# Patient Record
Sex: Male | Born: 1945 | Race: White | Hispanic: No | Marital: Married
Health system: Midwestern US, Academic
[De-identification: ages and names within clinical notes are randomized; demographics above are authoritative.]

---

## 2017-03-21 MED ORDER — SODIUM CHLORIDE 0.9 % IJ SOLN
50 mL | Freq: Once | INTRAVENOUS | 0 refills | Status: CP
Start: 2017-03-21 — End: ?

## 2017-03-21 MED ORDER — IOHEXOL 300 MG IODINE/ML IV SOLN
100 mL | Freq: Once | INTRAVENOUS | 0 refills | Status: CP
Start: 2017-03-21 — End: ?

## 2017-03-22 MED ORDER — (INV) IPILIMUMAB (SPECIAL CONC) IVPB
1 mg/kg | Freq: Once | INTRAVENOUS | 0 refills | Status: CN
Start: 2017-03-22 — End: ?

## 2017-03-22 MED ORDER — (INV) NIVOLUMAB IVPB
240 mg | Freq: Once | INTRAVENOUS | 0 refills | Status: CP
Start: 2017-03-22 — End: ?

## 2017-03-22 MED ORDER — (INV) NIVOLUMAB IVPB
240 mg | Freq: Once | INTRAVENOUS | 0 refills | Status: CN
Start: 2017-03-22 — End: ?

## 2017-03-22 MED ORDER — TRAMADOL 50 MG PO TAB
ORAL_TABLET | Freq: Four times a day (QID) | 0 refills | Status: DC | PRN
Start: 2017-03-22 — End: 2017-04-26

## 2017-03-29 MED ORDER — GABAPENTIN 400 MG PO CAP
800 mg | ORAL_CAPSULE | Freq: Two times a day (BID) | ORAL | 1 refills | Status: DC
Start: 2017-03-29 — End: 2017-04-15

## 2017-03-29 MED ORDER — SERTRALINE 100 MG PO TAB
200 mg | ORAL_TABLET | Freq: Every day | ORAL | 1 refills | Status: DC
Start: 2017-03-29 — End: 2017-04-15

## 2017-03-29 MED ORDER — TRAZODONE 100 MG PO TAB
ORAL_TABLET | 1 refills | Status: DC | PRN
Start: 2017-03-29 — End: 2017-06-29

## 2017-03-29 MED ORDER — ALPRAZOLAM 0.5 MG PO TAB
.25-.5 mg | ORAL_TABLET | Freq: Every day | ORAL | 1 refills | Status: DC | PRN
Start: 2017-03-29 — End: 2017-06-29

## 2017-04-05 MED ORDER — (INV) NIVOLUMAB IVPB
240 mg | Freq: Once | INTRAVENOUS | 0 refills | Status: CP
Start: 2017-04-05 — End: ?

## 2017-04-05 MED ORDER — (INV) IPILIMUMAB (SPECIAL CONC) IVPB
1 mg/kg | Freq: Once | INTRAVENOUS | 0 refills | Status: CP
Start: 2017-04-05 — End: ?

## 2017-04-07 MED ORDER — MIDAZOLAM 1 MG/ML IJ SOLN
1-2 mg | Freq: Once | INTRAVENOUS | 0 refills | Status: CP
Start: 2017-04-07 — End: ?

## 2017-04-07 MED ORDER — FENTANYL CITRATE (PF) 50 MCG/ML IJ SOLN
50-200 ug | Freq: Once | INTRAVENOUS | 0 refills | Status: CP
Start: 2017-04-07 — End: ?

## 2017-04-07 MED ORDER — CEFAZOLIN INJ 1GM IVP
0 refills | Status: CP
Start: 2017-04-07 — End: ?

## 2017-04-07 MED ORDER — FENTANYL CITRATE (PF) 50 MCG/ML IJ SOLN
0 refills | Status: CP
Start: 2017-04-07 — End: ?

## 2017-04-07 MED ORDER — MIDAZOLAM 1 MG/ML IJ SOLN
0 refills | Status: CP
Start: 2017-04-07 — End: ?

## 2017-04-07 MED ORDER — SODIUM CHLORIDE 0.9 % IV SOLP
0 refills | Status: CP
Start: 2017-04-07 — End: ?

## 2017-04-11 MED ORDER — LIDOCAINE-PRILOCAINE 2.5-2.5 % TP CREA
2 refills | Status: AC
Start: 2017-04-11 — End: ?

## 2017-04-12 MED ORDER — LEVOTHYROXINE 50 MCG PO TAB
50 ug | ORAL_TABLET | Freq: Every day | ORAL | 3 refills | 30.00000 days | Status: DC
Start: 2017-04-12 — End: 2017-04-12

## 2017-04-12 MED ORDER — LEVOTHYROXINE 50 MCG PO TAB
50 ug | ORAL_TABLET | Freq: Every day | ORAL | 0 refills | 30.00000 days | Status: DC
Start: 2017-04-12 — End: 2017-06-06

## 2017-04-15 MED ORDER — GABAPENTIN 400 MG PO CAP
800 mg | ORAL_CAPSULE | Freq: Two times a day (BID) | ORAL | 1 refills | Status: DC
Start: 2017-04-15 — End: 2017-06-22

## 2017-04-15 MED ORDER — SERTRALINE 100 MG PO TAB
200 mg | ORAL_TABLET | Freq: Every day | ORAL | 1 refills | Status: DC
Start: 2017-04-15 — End: 2017-09-21

## 2017-04-19 MED ORDER — HEPARIN, PORCINE (PF) 100 UNIT/ML IV SYRG
500 [IU] | Freq: Once | 0 refills | Status: CP
Start: 2017-04-19 — End: ?

## 2017-04-19 MED ORDER — (INV) NIVOLUMAB IVPB
240 mg | Freq: Once | INTRAVENOUS | 0 refills | Status: CP
Start: 2017-04-19 — End: ?

## 2017-04-26 MED ORDER — TRAMADOL 50 MG PO TAB
ORAL_TABLET | Freq: Four times a day (QID) | 0 refills | Status: DC | PRN
Start: 2017-04-26 — End: 2017-05-24

## 2017-05-03 MED ORDER — (INV) NIVOLUMAB IVPB
240 mg | Freq: Once | INTRAVENOUS | 0 refills | Status: CP
Start: 2017-05-03 — End: ?

## 2017-05-03 MED ORDER — HEPARIN, PORCINE (PF) 100 UNIT/ML IV SYRG
500 [IU] | Freq: Once | 0 refills | Status: CP
Start: 2017-05-03 — End: ?

## 2017-05-16 ENCOUNTER — Encounter: Admit: 2017-05-16 | Discharge: 2017-05-17 | Payer: MEDICARE

## 2017-05-16 ENCOUNTER — Encounter: Admit: 2017-05-16 | Discharge: 2017-05-16 | Payer: MEDICARE

## 2017-05-16 DIAGNOSIS — E042 Nontoxic multinodular goiter: ICD-10-CM

## 2017-05-16 DIAGNOSIS — C787 Secondary malignant neoplasm of liver and intrahepatic bile duct: ICD-10-CM

## 2017-05-16 DIAGNOSIS — Z452 Encounter for adjustment and management of vascular access device: ICD-10-CM

## 2017-05-16 DIAGNOSIS — C49A3 Gastrointestinal stromal tumor of small intestine: Principal | ICD-10-CM

## 2017-05-16 DIAGNOSIS — Z006 Encounter for examination for normal comparison and control in clinical research program: ICD-10-CM

## 2017-05-16 DIAGNOSIS — I2510 Atherosclerotic heart disease of native coronary artery without angina pectoris: ICD-10-CM

## 2017-05-16 DIAGNOSIS — Z79899 Other long term (current) drug therapy: Secondary | ICD-10-CM

## 2017-05-16 DIAGNOSIS — E039 Hypothyroidism, unspecified: ICD-10-CM

## 2017-05-16 DIAGNOSIS — N281 Cyst of kidney, acquired: ICD-10-CM

## 2017-05-16 LAB — URINALYSIS DIPSTICK
Lab: 1 (ref 1.003–1.035)
Lab: 6 (ref 5.0–8.0)

## 2017-05-16 LAB — CBC AND DIFF
Lab: 10 10*3/uL (ref 4.5–11.0)
Lab: 15 g/dL (ref 13.5–16.5)
Lab: 4.9 M/UL (ref 4.4–5.5)
Lab: 46 % (ref 40–50)

## 2017-05-16 MED ORDER — SODIUM CHLORIDE 0.9 % IJ SOLN
50 mL | Freq: Once | INTRAVENOUS | 0 refills | Status: CP
Start: 2017-05-16 — End: ?
  Administered 2017-05-16: 20:00:00 50 mL via INTRAVENOUS

## 2017-05-16 MED ORDER — HEPARIN, PORCINE (PF) 100 UNIT/ML IV SYRG
500 [IU] | Freq: Once | INTRAVENOUS | 0 refills | Status: CP
Start: 2017-05-16 — End: ?

## 2017-05-16 MED ORDER — IOHEXOL 300 MG IODINE/ML IV SOLN
100 mL | Freq: Once | INTRAVENOUS | 0 refills | Status: CP
Start: 2017-05-16 — End: ?
  Administered 2017-05-16: 20:00:00 100 mL via INTRAVENOUS

## 2017-05-16 NOTE — Progress Notes
Pt here for ND and access for scan.  Multiple tubes drawn.  Returned after scan for flush and deaccess.  Dismissed ambulatory, unaccompanied.

## 2017-05-17 ENCOUNTER — Encounter: Admit: 2017-05-17 | Discharge: 2017-05-17 | Payer: MEDICARE

## 2017-05-17 DIAGNOSIS — C49A3 Gastrointestinal stromal tumor of small intestine: Principal | ICD-10-CM

## 2017-05-17 DIAGNOSIS — C787 Secondary malignant neoplasm of liver and intrahepatic bile duct: ICD-10-CM

## 2017-05-17 DIAGNOSIS — E042 Nontoxic multinodular goiter: ICD-10-CM

## 2017-05-17 DIAGNOSIS — Z006 Encounter for examination for normal comparison and control in clinical research program: ICD-10-CM

## 2017-05-17 LAB — LIPASE: Lab: 30 U/L (ref 11–82)

## 2017-05-17 LAB — COMPREHENSIVE METABOLIC PANEL
Lab: 1.3 mg/dL — ABNORMAL HIGH (ref 0.4–1.24)
Lab: 100 MMOL/L (ref 98–110)
Lab: 119 mg/dL — ABNORMAL HIGH (ref 70–100)
Lab: 135 MMOL/L — ABNORMAL LOW (ref 137–147)
Lab: 137 U/L — ABNORMAL HIGH (ref 25–110)
Lab: 26 MMOL/L (ref 21–30)
Lab: 4.2 g/dL — ABNORMAL HIGH (ref 3.5–5.0)
Lab: 4.6 MMOL/L (ref 3.5–5.1)
Lab: 7.7 g/dL — ABNORMAL LOW (ref 6.0–8.0)
Lab: 81 U/L — ABNORMAL HIGH (ref 7–56)
Lab: 9 K/UL — ABNORMAL HIGH (ref 3–12)
Lab: 9.7 mg/dL (ref 8.5–10.6)
Lab: >60 mL/min (ref >60)

## 2017-05-17 LAB — FREE T4-FREE THYROXINE: Lab: 1.4 ng/dL (ref 0.6–1.6)

## 2017-05-17 LAB — TSH WITH FREE T4 REFLEX: Lab: 33 uU/mL — ABNORMAL HIGH (ref 0.35–5.00)

## 2017-05-17 LAB — CORTISOL,RANDOM: Lab: 7.8 ug/dL (ref 5.0–20.0)

## 2017-05-17 LAB — THYROID STIMULATING HORMONE-TSH: Lab: 33 uU/mL — ABNORMAL HIGH (ref 0.35–5.00)

## 2017-05-17 LAB — AMYLASE: Lab: 32 U/L — ABNORMAL LOW (ref >60)

## 2017-05-17 LAB — ANTI-THYROPEROXIDASE (MICROSOMAL)AB

## 2017-05-17 LAB — THYROGLOBULIN AB: Lab: 3.3 [IU]/mL (ref <4.11)

## 2017-05-17 LAB — FREE T4 (FREE THYROXINE) ONLY: Lab: 1.4 ng/dL (ref 0.6–1.6)

## 2017-05-17 MED ORDER — REGORAFENIB 40 MG PO TAB
ORAL_TABLET | Freq: Every day | ORAL | 0 refills | Status: AC
Start: 2017-05-17 — End: 2017-08-10

## 2017-05-17 NOTE — Progress Notes
Initial Assessment: Oral Chemotherapy  Regorafenib (Stivarga???)    Willie Sandoval is a 71 y.o. male with a diagnosis of Recurrent GIST    Indication/Regimen    Regorafenib (Stivarga???) is being used appropriately for treatment of Recurrent GIST.  This medication is considered high risk per our internal oral chemotherapy risk categorization and the patient will be contacted for education, toxicity check at 2 weeks, and reassessment every 3 months, if applicable (high risk patient).      The dosing regimen of Regorafenib 160 mg (four 40 mg tabs) by mouth once daily on Days 1 to 21 of a 28-day cycle is appropriate for [name]. It is planned to continue until progression or unacceptable toxicity.     Patient History:   Cancer Diagnosis: Recurrent GIST   Past treatment regimens:   Gleevec  Sutent  DART trial - combined immunotherapy  D/t progression, plans now to transition to Regorafenib     Past Treatment Plans    ONCOLOGY 1   Plan Name Cycles Start Date Discontinue Date Discontinue Reason Discontinue User    (INV) HSC [phone]; [phone]; NIVOLUMAB + IPILIMUMAB 4 of 8 cycles started 11/30/2016 05/17/2017 Progression Stasia Cavalier, MD    OP GI SUNITINIB 37.5 MG (CONTINUOUSLY) (PNET/GIST)  Treatment not started 09/03/2016 11/26/2016 Progression Stasia Cavalier, MD    OP GI IMATINIB Treatment not started 04/23/2016 09/03/2016 Progression Stasia Cavalier, MD          Wt Readings from Last 1 Encounters:   05/17/17 126.7 kg (279 lb 6.4 oz)        Estimated body surface area is 2.59 meters squared as calculated from the following:    Height as of an earlier encounter on 05/17/17: 190.5 cm (75).    Weight as of an earlier encounter on 05/17/17: 126.7 kg (279 lb 6.4 oz).    Allergies:  Allergies   Allergen Reactions   ??? Levaquin [Levofloxacin] ANAPHYLAXIS   ??? 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 Wool RASH       Baseline Labs:     CBC w/Diff    Lab Results Component Value Date/Time    WBC 10.4 05/16/2017 01:40 PM    RBC 4.92 05/16/2017 01:40 PM    HGB 15.7 05/16/2017 01:40 PM    HCT 46.2 05/16/2017 01:40 PM    MCV 94.1 05/16/2017 01:40 PM    MCH 31.9 05/16/2017 01:40 PM    MCHC 33.9 05/16/2017 01:40 PM    RDW 14.5 05/16/2017 01:40 PM    PLTCT 182 05/16/2017 01:40 PM    MPV 8.0 05/16/2017 01:40 PM    Lab Results   Component Value Date/Time    NEUT 67 05/16/2017 01:40 PM    ANC 7.00 05/16/2017 01:40 PM    LYMA 16 (L) 05/16/2017 01:40 PM    ALC 1.70 05/16/2017 01:40 PM    MONA 8 05/16/2017 01:40 PM    AMC 0.80 05/16/2017 01:40 PM    EOSA 8 (H) 05/16/2017 01:40 PM    AEC 0.80 (H) 05/16/2017 01:40 PM    BASA 1 05/16/2017 01:40 PM    ABC 0.10 05/16/2017 01:40 PM          Comprehensive Metabolic Profile    Lab Results   Component Value Date/Time    NA 135 (L) 05/16/2017 01:40 PM    K 4.6 05/16/2017 01:40 PM  CL 100 05/16/2017 01:40 PM    CO2 26 05/16/2017 01:40 PM    GAP 9 05/16/2017 01:40 PM    BUN 18 05/16/2017 01:40 PM    CR 1.32 (H) 05/16/2017 01:40 PM    GLU 119 (H) 05/16/2017 01:40 PM    Lab Results   Component Value Date/Time    CA 9.7 05/16/2017 01:40 PM    PO4 3.9 06/27/2015 05:04 AM    ALBUMIN 4.2 05/16/2017 01:40 PM    TOTPROT 7.7 05/16/2017 01:40 PM    ALKPHOS 137 (H) 05/16/2017 01:40 PM    AST 86 (H) 05/16/2017 01:40 PM    ALT 81 (H) 05/16/2017 01:40 PM    TOTBILI 0.8 05/16/2017 01:40 PM    GFR 54 (L) 05/16/2017 01:40 PM    GFRAA >60 05/16/2017 01:40 PM        Serum creatinine: 1.32 mg/dL (H) 54/09/81 1914  Estimated creatinine clearance: 75.8 mL/min (A)    Pregnancy status    The patient???s pregnancy status was assessed. As patient is a male, education will be provided regarding adequate contraception for male partners of reproductive potential and contacting his physician immediately should his partner become pregnant.     Medication Reconciliation    Home Medications    Medication Sig acetaminophen (TYLENOL) 500 mg tablet Take 500 mg by mouth every 6 hours as needed for Pain. Max of 4,000 mg of acetaminophen in 24 hours.   ALPRAZolam (XANAX) 0.5 mg tablet Take 0.5-1 tablets by mouth daily as needed for Anxiety.   aspirin EC 81 mg tablet Take 81 mg by mouth daily.   BIFIDOBACTERIUM INFANTIS (ALIGN PO) Take 1 capsule by mouth. 5 billion cell capsule   Biotin 10,000 mcg cap Take 1 capsule by mouth daily.   CELECOXIB (CELEBREX PO) Take 1 tablet by mouth daily.   cyanocobalamin(+) (VITAMIN B-12) 500 mcg tablet Take 500 mcg by mouth daily.   cyclobenzaprine (FLEXERIL) 10 mg tablet Take 1 tablet by mouth twice daily as needed for Muscle Cramps.   ferrous sulfate (FEOSOL, FEROSUL) 325 mg (65 mg iron) tablet Take 325 mg by mouth daily. Take on an empty stomach at least 1 hour before or 2 hours after food.   fish oil /omega-3 fatty acids (SEA-OMEGA) 340/1000 mg capsule Take 1 Cap by mouth daily.   furosemide (LASIX) 40 mg tablet Take 40 mg by mouth daily.   gabapentin (NEURONTIN) 400 mg capsule Take 2 capsules by mouth twice daily. 90 day supply   Garlic 1,000 mg cap Take 1 Cap by mouth daily.   GLUCOSAMINE SULF/CHONDROITIN A (GLUCOSAMINE SULF-CHONDROITINSA PO) Take 1 tablet by mouth twice daily.   levothyroxine (SYNTHROID) 50 mcg tablet Take 1 tablet by mouth daily 30 minutes before breakfast.   lidocaine viscous-diphenhydrAMINE-alum/mag hydroxide/simeth 1:1:1 suspension Swish and Spit 10 mL by mouth as directed before meals and at bedtime.   lidocaine/prilocaine (EMLA) 2.5/2.5 % topical cream Apply to port area 30 minutes prior to access   lisinopril (PRINIVIL; ZESTRIL) 5 mg tablet Take 5 mg by mouth daily.   magnesium oxide (MAG-OX) 400 mg tablet Take 400 mg by mouth daily.   metoprolol (LOPRESSOR) 25 mg tablet Take 25 mg by mouth twice daily.   MV with Min-Lycopene-Lutein (CENTRUM SILVER) 0.4-300-250 mg-mcg-mcg tab Take 1 Tab by mouth daily. ondansetron (ZOFRAN) 8 mg tablet Take 1 tablet by mouth every 8 hours as needed for Nausea.   oxymetazoline (AFRIN) 0.05 % nasal spray Apply 1 Spray to each nostril as directed  twice daily.   potassium chloride SR (K-DUR) 20 mEq tablet Take 20 mEq by mouth daily. Take with a meal and a full glass of water.   ranitidine(+) (ZANTAC) 300 mg tablet Take 300 mg by mouth daily.   regorafenib (STIVARGA) 40 mg tablet Take 4 tabs by mouth daily on Days 1-21 of each 28-day cycle. Take at the same time each day with a low fat meal.   sertraline (ZOLOFT) 100 mg tablet Take 2 tablets by mouth daily. 90 day supply   simethicone (MYLICON) 80 mg chew tablet Take 1 Tab by mouth every 6 hours as needed.  Patient taking differently: Chew 80-160 mg by mouth daily as needed.   sucralfate (CARAFATE) 1 gram tablet Take 1 g by mouth daily as needed (as direc). Take on an empty stomach.   tamsulosin (FLOMAX) 0.4 mg capsule Take 1 Cap by mouth daily after breakfast.   traMADol (ULTRAM) 50 mg tablet 1-2 tablets every 6 hours as needed for pain   traZODone (DESYREL) 100 mg tablet Take 50 mg qhs.  May repeat an additional 50 mg qhs prn if not asleep in 1 hour.  90 day supply   triamcinolone (NASACORT) 55 mcg nasal inhaler Apply 2 Sprays to each nostril as directed at bedtime as needed.   vitamin E 400 unit capsule Take 400 Units by mouth daily.       Medication reconciliation is based on the patient???s most recent medication list in the electronic medical record (EMR) including herbal products and OTC medications. The patient's medication list will be updated during patient education, after speaking with the patient and prior to dispensing the medication.     Drug-drug interactions (DDIs)    DDIs were evaluated: No significant drug-drug interactions were identified.     Drug-Food Interactions    Drug-food interactions were evaluated.  Doses should be swallow whole with water after a low-fat meal (containing <600 calories and <30% fat).  Avoid grapefruit.    Contraindications    No contraindications to therapy were identified as there are no contraindications in the prescribing information.     Safety Precautions    The following safety precautions to the use of Regorafenib (Stivarga???) were reviewed:  ??? Cardiovascular events (myocardial ischemia, myocardial infarction)  ??? Dermatologic toxicity (hand-food skin reaction, erythema multiforme, Levonne Spiller syndrome)  ??? Gastrointestinal perforation  ??? Hemorrhage  ??? Hepatotoxicity  ??? Hypersensitivity  ??? Hypertension  ??? Reversible posterior leukoencephalopathy syndrome  ??? Wound healing impairment  ??? Asian patients: higher risk of hepatotoxicity and hand-foot skin reactions    Safety precautions for this medication have been reviewed. No concerns have been identified.     Risk Evaluation and Mitigation Strategy (REMS) Assessment    No REMS is required for this medication.     Initial therapy assessment has been completed and the patient will be contacted to complete education on their regimen.     Ramond Craver, [facility]  Clinical Pharmacist  05/17/17

## 2017-05-17 NOTE — Progress Notes
The Prior Authorization for Willie Sandoval was submitted for Willie Sandoval via [place].  Will continue to follow.    [place] Patient Advocate

## 2017-05-17 NOTE — Research Notes
Clinical research note for ZOX#[id number]   [id number] DART     SID # [id number]  End of treatment.    Patient is here for end of treatment visit due to progression by Recist 1.1.  Patient's last dose of study drug, nivolumab was on 05-03-2017 (Week 23).      Patient agrees to continue study follow up for survival.   Patient agrees to have disease progression study lab kit drawn at next clinic visit.  This should be next week.    Advised patient of need to follow-up with patient for 30 day follow-up safety visit which is done for all study patients.   Patient acknowledged understanding.     Per Dr. Edwin Dada, patient's TSH is elevated, yet Free T4 is WNL.   Patient's creatinine is also slightly elevated. Reviewed study algorithms for immunotherapy adverse events with Dr. Edwin Dada.  He will continue to monitor both levels for patient safety, but doesn't believe patient needs treatment at this time.    VF

## 2017-05-18 ENCOUNTER — Encounter: Admit: 2017-05-18 | Discharge: 2017-05-18 | Payer: MEDICARE

## 2017-05-18 DIAGNOSIS — E039 Hypothyroidism, unspecified: ICD-10-CM

## 2017-05-18 DIAGNOSIS — C49A4 Gastrointestinal stromal tumor of large intestine: Principal | ICD-10-CM

## 2017-05-18 LAB — ACTH: Lab: 5.6 U/L — ABNORMAL LOW (ref 7–40)

## 2017-05-18 NOTE — Progress Notes
The Prior Authorization for Willie Sandoval was approved for Willie Sandoval from 03/13/17 to 05/16/20.  The copay is [phone]. I left a voicemail for Willie Sandoval to see if he would like [place] to look for asst.    [place] Patient Advocate

## 2017-05-19 ENCOUNTER — Encounter: Admit: 2017-05-19 | Discharge: 2017-05-19 | Payer: MEDICARE

## 2017-05-19 NOTE — Progress Notes
Willie Sandoval has stated the Stivarga copay is not affordable.  Pharmacy will work on obtaining copay assistance and are currently waiting for him to fax over the household income.    [place] Patient Advocate

## 2017-05-19 NOTE — Telephone Encounter
Contacted pt and message given  He verbalized understanding    Labs ordered per Dr. Kalman Shan

## 2017-05-20 ENCOUNTER — Encounter: Admit: 2017-05-20 | Discharge: 2017-05-20 | Payer: MEDICARE

## 2017-05-20 DIAGNOSIS — E785 Hyperlipidemia, unspecified: ICD-10-CM

## 2017-05-20 DIAGNOSIS — I10 Essential (primary) hypertension: ICD-10-CM

## 2017-05-20 DIAGNOSIS — M7030 Other bursitis of elbow, unspecified elbow: ICD-10-CM

## 2017-05-20 DIAGNOSIS — C171 Malignant neoplasm of jejunum: Principal | ICD-10-CM

## 2017-05-20 DIAGNOSIS — Z9221 Personal history of antineoplastic chemotherapy: ICD-10-CM

## 2017-05-20 DIAGNOSIS — M1990 Unspecified osteoarthritis, unspecified site: ICD-10-CM

## 2017-05-20 DIAGNOSIS — F329 Major depressive disorder, single episode, unspecified: ICD-10-CM

## 2017-05-20 DIAGNOSIS — M5441 Lumbago with sciatica, right side: Secondary | ICD-10-CM

## 2017-05-20 DIAGNOSIS — F419 Anxiety disorder, unspecified: ICD-10-CM
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
## 2017-05-20 NOTE — Progress Notes
Seven Element Order    1. Patient Name: Willie Sandoval    2. Face to Face Completion Date: 05/20/2017  *Per Medicare guidelines, use the most recent date -- either the date of the office visit or the date the PT/ OT evaluation is signed.    3. Equipment Recommended: power wheelchair    4. All diagnoses relating to the need for recommended equipment:  1. Chronic bilateral low back pain with bilateral sciatica     2. GIST (gastrointestinal stromal tumor) of small bowel, malignant         5. Estimated Length of Need in Months, 1-99 (99=Lifetime): 99    6. Doctor Signature                                                  Heide Scales, M.D.    7. Date: 05/20/2017

## 2017-05-20 NOTE — Progress Notes
Emailed [facility]NC to obtain providers signature on [employer] application to the drug co.

## 2017-05-21 ENCOUNTER — Ambulatory Visit: Admit: 2017-05-20 | Discharge: 2017-05-21 | Payer: MEDICARE

## 2017-05-21 DIAGNOSIS — G8929 Other chronic pain: ICD-10-CM

## 2017-05-21 DIAGNOSIS — M5442 Lumbago with sciatica, left side: Principal | ICD-10-CM

## 2017-05-21 DIAGNOSIS — C49A3 Gastrointestinal stromal tumor of small intestine: ICD-10-CM

## 2017-05-23 ENCOUNTER — Encounter: Admit: 2017-05-23 | Discharge: 2017-05-23 | Payer: MEDICARE

## 2017-05-23 ENCOUNTER — Encounter: Admit: 2017-05-23 | Discharge: 2017-05-24 | Payer: MEDICARE

## 2017-05-23 DIAGNOSIS — C787 Secondary malignant neoplasm of liver and intrahepatic bile duct: ICD-10-CM

## 2017-05-23 DIAGNOSIS — I10 Essential (primary) hypertension: ICD-10-CM

## 2017-05-23 DIAGNOSIS — F419 Anxiety disorder, unspecified: ICD-10-CM

## 2017-05-23 DIAGNOSIS — M7030 Other bursitis of elbow, unspecified elbow: ICD-10-CM

## 2017-05-23 DIAGNOSIS — E039 Hypothyroidism, unspecified: ICD-10-CM

## 2017-05-23 DIAGNOSIS — C49A3 Gastrointestinal stromal tumor of small intestine: Principal | ICD-10-CM

## 2017-05-23 DIAGNOSIS — C171 Malignant neoplasm of jejunum: Principal | ICD-10-CM

## 2017-05-23 DIAGNOSIS — Z9221 Personal history of antineoplastic chemotherapy: ICD-10-CM

## 2017-05-23 DIAGNOSIS — M1990 Unspecified osteoarthritis, unspecified site: ICD-10-CM

## 2017-05-23 DIAGNOSIS — C49A4 Gastrointestinal stromal tumor of large intestine: ICD-10-CM

## 2017-05-23 DIAGNOSIS — E785 Hyperlipidemia, unspecified: ICD-10-CM

## 2017-05-23 DIAGNOSIS — F329 Major depressive disorder, single episode, unspecified: ICD-10-CM

## 2017-05-23 LAB — TSH WITH FREE T4 REFLEX: Lab: 14 uU/mL — ABNORMAL HIGH (ref 0.35–5.00)

## 2017-05-23 LAB — FREE T4-FREE THYROXINE: Lab: 1.8 ng/dL — ABNORMAL HIGH (ref 0.6–1.6)

## 2017-05-23 MED ORDER — HEPARIN, PORCINE (PF) 100 UNIT/ML IV SYRG
500 [IU] | Freq: Once | 0 refills | Status: CP
Start: 2017-05-23 — End: ?
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
## 2017-05-23 NOTE — Patient Education
Oral Chemotherapy Counseling  Regorafenib (Stivarga???)    Willie Sandoval and his wife were provided medication education regarding his new oral chemotherapy.     I reviewed the role of Willie Sandoval specialty pharmacy, including access to medication assistance specialists if needed.     How to take the medication:  Willie Sandoval was educated on regorafenib [name]???), the indication for treatment, dose, route, frequency and duration of therapy.     ??? Directions: 160 mg (four 40 mg tablets) by mouth once daily after a low-fat meal (<600 calories and <30% fat) on Days 1 through 21 of a 28-day cycle until disease progression or unacceptable toxicity.  ??? Patient was educated to swallow tablets whole and not to crush, chew or open tablets.     How to Store Medication:  YISHAI Sandoval was educated to store regorafenib (Stivarga???) at room temperature in a safe place away from humidity, pets, and children.  I recommended storing the medication in the original bottle and to leave the desiccant in place to protect from moisture.  I also recommended to discard any unused tablets 7 weeks after opening the bottle.  I recommended if family members would be handling the medication, they should use gloves.  Additionally, I recommended cleaning any surfaces touched by regorafenib (Stivarga???) with bleach, if possible.     Adherence:  Patient was educated on the importance of adherence.The patient's ability to be adherent with drug therapies was discussed and the patient was provided options for tools/resources that promote adherence to therapy. For Willie Sandoval calendars were recommended since the medication should not be put in a pill box (how he remembers the rest of his meds).     How to Manage Missed Doses:  I instructed the patient that if a dose is missed or vomited up, he should not take an extra dose to make it up. Instead, resume the medication at your next scheduled dose. Contraindications / Safety Precautions / Adverse Effects:  Contraindications to therapy, safety precautions, and common adverse effects were discussed with the patient.     REMS Program:  No REMS is required for this medication.    Drug-Drug Interactions:  A medication history and reconciliation was performed (including prescription medications, supplements, over the counter medications, and herbal products). The medication list was updated and the patient???s current medication list is included below.  I stressed the importance of maintaining an accurate medication list and informing their medical team prior to taking any new medications.     Home Medications    Medication Sig   acetaminophen (TYLENOL) 500 mg tablet Take 500 mg by mouth every 6 hours as needed for Pain. Max of 4,000 mg of acetaminophen in 24 hours.   ALPRAZolam (XANAX) 0.5 mg tablet Take 0.5-1 tablets by mouth daily as needed for Anxiety.   aspirin EC 81 mg tablet Take 81 mg by mouth daily.   BIFIDOBACTERIUM INFANTIS (ALIGN PO) Take 1 capsule by mouth. 5 billion cell capsule   Biotin 10,000 mcg cap Take 1 capsule by mouth daily.   CELECOXIB (CELEBREX PO) Take 1 tablet by mouth daily.   cyanocobalamin(+) (VITAMIN B-12) 500 mcg tablet Take 500 mcg by mouth daily.   cyclobenzaprine (FLEXERIL) 10 mg tablet Take 1 tablet by mouth twice daily as needed for Muscle Cramps.   ferrous sulfate (FEOSOL, FEROSUL) 325 mg (65 mg iron) tablet Take 325 mg by mouth daily. Take on an empty stomach at least 1 hour before or 2 hours  after food.   fish oil /omega-3 fatty acids (SEA-OMEGA) 340/1000 mg capsule Take 1 Cap by mouth daily.   furosemide (LASIX) 40 mg tablet Take 40 mg by mouth daily.   gabapentin (NEURONTIN) 400 mg capsule Take 2 capsules by mouth twice daily. 90 day supply   Garlic 1,000 mg cap Take 1 Cap by mouth daily.   GLUCOSAMINE SULF/CHONDROITIN A (GLUCOSAMINE SULF-CHONDROITINSA PO) Take 1 tablet by mouth twice daily. levothyroxine (SYNTHROID) 50 mcg tablet Take 1 tablet by mouth daily 30 minutes before breakfast.   lidocaine viscous-diphenhydrAMINE-alum/mag hydroxide/simeth 1:1:1 suspension Swish and Spit 10 mL by mouth as directed before meals and at bedtime.   lidocaine/prilocaine (EMLA) 2.5/2.5 % topical cream Apply to port area 30 minutes prior to access   lisinopril (PRINIVIL; ZESTRIL) 5 mg tablet Take 5 mg by mouth daily.   magnesium oxide (MAG-OX) 400 mg tablet Take 400 mg by mouth daily.   metoprolol (LOPRESSOR) 25 mg tablet Take 25 mg by mouth twice daily.   MV with Min-Lycopene-Lutein (CENTRUM SILVER) 0.4-300-250 mg-mcg-mcg tab Take 1 Tab by mouth daily.   ondansetron (ZOFRAN) 8 mg tablet Take 1 tablet by mouth every 8 hours as needed for Nausea.   oxymetazoline (AFRIN) 0.05 % nasal spray Apply 1 Spray to each nostril as directed twice daily.   potassium chloride SR (K-DUR) 20 mEq tablet Take 20 mEq by mouth daily. Take with a meal and a full glass of water.   ranitidine(+) (ZANTAC) 300 mg tablet Take 300 mg by mouth daily.   regorafenib (STIVARGA) 40 mg tablet Take 4 tabs by mouth daily on Days 1-21 of each 28-day cycle. Take at the same time each day with a low fat meal.   sertraline (ZOLOFT) 100 mg tablet Take 2 tablets by mouth daily. 90 day supply   simethicone (MYLICON) 80 mg chew tablet Take 1 Tab by mouth every 6 hours as needed.  Patient taking differently: Chew 80-160 mg by mouth daily as needed.   sucralfate (CARAFATE) 1 gram tablet Take 1 g by mouth daily as needed (as direc). Take on an empty stomach.   tamsulosin (FLOMAX) 0.4 mg capsule Take 1 Cap by mouth daily after breakfast.   traMADol (ULTRAM) 50 mg tablet 1-2 tablets every 6 hours as needed for pain   traZODone (DESYREL) 100 mg tablet Take 50 mg qhs.  May repeat an additional 50 mg qhs prn if not asleep in 1 hour.  90 day supply   triamcinolone (NASACORT) 55 mcg nasal inhaler Apply 2 Sprays to each nostril as directed at bedtime as needed. vitamin E 400 unit capsule Take 400 Units by mouth daily.     Drug-drug and drug-food interactions with the new therapy were assessed and reviewed with the patient. No significant drug-drug interactions were identified.   Patient was educated to avoid grapefruit.    What to do with any unused or expired medications:  NURI BILLEY was instructed to return any unused or expired medication to a disposal bin at one of the retail pharmacy locations or to utilize a community drug take back program.  Instructed not to flush the medication down the toilet.     Monitoring:  Monitoring and follow-up plan was discussed with patient. DAKEEM OLSHANSKY was instructed to contact the oral chemotherapy pharmacist at [phone] if they have any questions or concerns regarding their medication therapy. He is aware that the drug company is processing his application. He will call when he receives the medication so  that we can schedule appropriate follow up.     Questions:  Patient was given the opportunity to ask questions. Patient verbalized understanding, agreed with the plan and had no questions or concerns regarding therapy.     Lisette Abu Coti Burd, [facility]  Clinical Pharmacist  05/23/17

## 2017-05-24 ENCOUNTER — Encounter: Admit: 2017-05-24 | Discharge: 2017-05-24 | Payer: MEDICARE

## 2017-05-24 DIAGNOSIS — C49A3 Gastrointestinal stromal tumor of small intestine: Principal | ICD-10-CM

## 2017-05-24 DIAGNOSIS — F4323 Adjustment disorder with mixed anxiety and depressed mood: ICD-10-CM

## 2017-05-24 DIAGNOSIS — E039 Hypothyroidism, unspecified: Principal | ICD-10-CM

## 2017-05-24 DIAGNOSIS — F1010 Alcohol abuse, uncomplicated: ICD-10-CM

## 2017-05-24 LAB — CORTISOL,RANDOM: Lab: 6.9 ug/dL (ref 5.0–20.0)

## 2017-05-24 LAB — ACTH: Lab: 8.4

## 2017-05-24 MED ORDER — TRAMADOL 50 MG PO TAB
ORAL_TABLET | Freq: Four times a day (QID) | 0 refills | Status: AC | PRN
Start: 2017-05-24 — End: 2017-06-23
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
## 2017-05-24 NOTE — Telephone Encounter
Contacted pt and message given  Understanding verbalized  Lab ordered for one month    Closing encounter

## 2017-05-24 NOTE — Telephone Encounter
Please let pt know cortisol lab is normal for being drawn in the afternoon.  Thyroid labs are abnormal, but likely due to him having CT with contrast recently.  No change in his levothyroxine dose today.  Will you please order TSH and free T4 to be drawn in about a month?  Can be drawn with oncology labs.  thanks

## 2017-05-26 ENCOUNTER — Encounter: Admit: 2017-05-26 | Discharge: 2017-05-26 | Payer: MEDICARE

## 2017-05-26 NOTE — Progress Notes
Emailed [facility] to follow up on obtaining providers signature on Stivarga application to the drug co.

## 2017-05-26 NOTE — Progress Notes
Faxed Stivarga application to the drug co.

## 2017-05-30 ENCOUNTER — Encounter: Admit: 2017-05-30 | Discharge: 2017-05-30 | Payer: MEDICARE

## 2017-05-30 NOTE — Progress Notes
Willie Sandoval is still in process with the drug co.

## 2017-05-31 ENCOUNTER — Encounter: Admit: 2017-05-31 | Discharge: 2017-05-31 | Payer: MEDICARE

## 2017-05-31 NOTE — Progress Notes
The medication assistance application for Willie Sandoval has been approved for Willie Sandoval from 05/31/17 to 05/31/18.  The patient will receive the medication directly from the drug company during the approval period.    Drug Company Medication Nutritional therapist Information: [facility] Patient Advocate

## 2017-06-06 ENCOUNTER — Encounter: Admit: 2017-06-06 | Discharge: 2017-06-06 | Payer: MEDICARE

## 2017-06-06 MED ORDER — LEVOTHYROXINE 50 MCG PO TAB
50 ug | ORAL_TABLET | Freq: Every day | ORAL | 0 refills | 30.00000 days | Status: AC
Start: 2017-06-06 — End: 2017-07-22

## 2017-06-06 NOTE — Research Notes
Clinical research note for HKG#[id number]   S1609 DART     SID # [id number]  30 day safety follow-up    This CRC called patient 06/02/17 and 06/03/17 both home and cell numbers without success, even when leaving a message on his cell.    This CRC was able to successfully contact patient by his cell today.  He states he has been out of town.    I advised patient this is the 30 day study safety follow-up contact regarding adverse events.   I reviewed the ongoing AE's with the patient and updated the adverse event log.  AE log to be sent to Dr. Edwin Dada for review.    Patient states he has not experienced any new side effects in the approximately 30 days from his last dose.   Also states that he hasn't started his new oral treatment regimen yet, since he has been out of town.    Advised patient to call with any research related questions.  Patient verbalized understanding.      VF

## 2017-06-14 ENCOUNTER — Encounter: Admit: 2017-06-14 | Discharge: 2017-06-14 | Payer: MEDICARE

## 2017-06-14 NOTE — Telephone Encounter
The patient called to report he received his first shipment of regorafenib. Informed the patient that he can start taking it tomorrow morning or he can wait until after his lab draw on Thursday, either way is okay per Dr. Edwin Dada. The patient asked about a low fat diet with taking this medication. I discussed with pharmacy and informed the patient he will need to have a low fat breakfast when he takes this medication. The patient reports he usually eats a biscuit for breakfast and that should be okay to continue. All questions answered. Scheduling will reach out to the patient to schedule a two week lab and follow up with the NP.

## 2017-06-16 ENCOUNTER — Encounter: Admit: 2017-06-16 | Discharge: 2017-06-16 | Payer: MEDICARE

## 2017-06-16 ENCOUNTER — Encounter: Admit: 2017-06-16 | Discharge: 2017-06-17 | Payer: MEDICARE

## 2017-06-16 DIAGNOSIS — C49A3 Gastrointestinal stromal tumor of small intestine: Principal | ICD-10-CM

## 2017-06-16 LAB — CBC AND DIFF
Lab: 0.1 10*3/uL (ref 0–0.20)
Lab: 4.9 M/UL (ref 4.4–5.5)
Lab: 9.7 K/UL (ref 4.5–11.0)

## 2017-06-16 MED ORDER — HEPARIN, PORCINE (PF) 100 UNIT/ML IV SYRG
500 [IU] | Freq: Once | INTRAVENOUS | 0 refills | Status: CP
Start: 2017-06-16 — End: ?
  Administered 2017-06-16: 20:00:00 500 [IU] via INTRAVENOUS

## 2017-06-16 NOTE — Progress Notes
Port accessed.  Labs drawn.  Pt denies complaints.  Pt here with wife.    Port de-accessed.  Pt discharged in stable condition.

## 2017-06-17 LAB — COMPREHENSIVE METABOLIC PANEL
Lab: 1 mg/dL (ref 0.3–1.2)
Lab: 1.4 mg/dL — ABNORMAL HIGH (ref 0.4–1.24)
Lab: 10 mg/dL (ref 8.5–10.6)
Lab: 100 MMOL/L (ref 98–110)
Lab: 133 U/L — ABNORMAL HIGH (ref 25–110)
Lab: 139 MMOL/L (ref 137–147)
Lab: 19 mg/dL (ref 7–25)
Lab: 30 MMOL/L (ref 21–30)
Lab: 4.2 g/dL — ABNORMAL LOW (ref 3.5–5.0)
Lab: 4.3 MMOL/L (ref 3.5–5.1)
Lab: 49 mL/min — ABNORMAL LOW (ref >60)
Lab: 59 mL/min — ABNORMAL LOW (ref >60)
Lab: 61 U/L — ABNORMAL HIGH (ref 7–56)
Lab: 7.8 g/dL (ref 6.0–8.0)
Lab: 71 U/L — ABNORMAL HIGH (ref 7–40)
Lab: 9 K/UL (ref 3–12)
Lab: 98 mg/dL (ref 70–100)

## 2017-06-20 ENCOUNTER — Encounter: Admit: 2017-06-20 | Discharge: 2017-06-20 | Payer: MEDICARE

## 2017-06-20 NOTE — Telephone Encounter
Attempted to call Mitzi Davenport to perform reassessment for oral chemotherapy medication.  No answer. Left voicemail asking patient to return call to the pharmacist at [phone].       Ainsley Spinner, [facility]  Oncology Clinical Pharmacist  06/20/2017

## 2017-06-22 ENCOUNTER — Encounter: Admit: 2017-06-22 | Discharge: 2017-06-22 | Payer: MEDICARE

## 2017-06-22 DIAGNOSIS — F4323 Adjustment disorder with mixed anxiety and depressed mood: Principal | ICD-10-CM

## 2017-06-22 MED ORDER — GABAPENTIN 400 MG PO CAP
ORAL_CAPSULE | Freq: Two times a day (BID) | 1 refills | Status: AC
Start: 2017-06-22 — End: 2017-12-14

## 2017-06-23 ENCOUNTER — Encounter: Admit: 2017-06-23 | Discharge: 2017-06-23 | Payer: MEDICARE

## 2017-06-23 DIAGNOSIS — F4323 Adjustment disorder with mixed anxiety and depressed mood: Principal | ICD-10-CM

## 2017-06-23 DIAGNOSIS — F1010 Alcohol abuse, uncomplicated: ICD-10-CM

## 2017-06-23 DIAGNOSIS — C49A3 Gastrointestinal stromal tumor of small intestine: ICD-10-CM

## 2017-06-23 DIAGNOSIS — C787 Secondary malignant neoplasm of liver and intrahepatic bile duct: ICD-10-CM

## 2017-06-23 MED ORDER — TRAMADOL 50 MG PO TAB
ORAL_TABLET | Freq: Four times a day (QID) | 0 refills | Status: AC | PRN
Start: 2017-06-23 — End: 2017-07-27

## 2017-06-28 NOTE — Progress Notes
CONFIDENTIAL  Follow-up note    PATIENT: Willie Sandoval  DOB: [birth year]  DOS:06/23/2017  TIME: 9:00-9:50am  INTERVENTION: Supportive and CBT    SUMMARY:  Pt seen for follow-up psychotherapy.  Focus of discussion on patient's updates since beginning new treatment and recent vacation.  Patient noted some fatigue and mouth pain, which has decreased his productive tasks and use of alcohol.  Patient noted feeling somewhat like a burden to [place], but has been using CBT to aid in those thoughts.  Supportive therapy provided with review of coping strategies.    MSE/ASSESSMENT: Patient alert and Ox3. Speech fluid. Thoughts lucid and w/o evidence of psychoses. Memory grossly intact. Patient cooperative and with appropriate eye contact. Mood calm with congruent affect. Insight, judgment, and impulse control sufficient. No SI/HI, plans, or intent endorsed at this time.     IMPRESSIONS (based on DSM V): Alcohol abuse           Adjustment disorder with mixed anxiety and depressed mood            Medical Diagnosis GIST    PLAN: CBT    Vita Erm, PhD  Licensed Psychologist  [phone]

## 2017-06-29 ENCOUNTER — Ambulatory Visit: Admit: 2017-06-29 | Discharge: 2017-06-29 | Payer: MEDICARE

## 2017-06-29 ENCOUNTER — Encounter: Admit: 2017-06-29 | Discharge: 2017-06-29 | Payer: MEDICARE

## 2017-06-29 ENCOUNTER — Encounter: Admit: 2017-06-29 | Discharge: 2017-06-30 | Payer: MEDICARE

## 2017-06-29 DIAGNOSIS — F411 Generalized anxiety disorder: Principal | ICD-10-CM

## 2017-06-29 DIAGNOSIS — M7030 Other bursitis of elbow, unspecified elbow: ICD-10-CM

## 2017-06-29 DIAGNOSIS — C49A3 Gastrointestinal stromal tumor of small intestine: Principal | ICD-10-CM

## 2017-06-29 DIAGNOSIS — E785 Hyperlipidemia, unspecified: ICD-10-CM

## 2017-06-29 DIAGNOSIS — C171 Malignant neoplasm of jejunum: Principal | ICD-10-CM

## 2017-06-29 DIAGNOSIS — F1020 Alcohol dependence, uncomplicated: ICD-10-CM

## 2017-06-29 DIAGNOSIS — Z9221 Personal history of antineoplastic chemotherapy: ICD-10-CM

## 2017-06-29 DIAGNOSIS — M1990 Unspecified osteoarthritis, unspecified site: ICD-10-CM

## 2017-06-29 DIAGNOSIS — R7989 Other specified abnormal findings of blood chemistry: ICD-10-CM

## 2017-06-29 DIAGNOSIS — E039 Hypothyroidism, unspecified: ICD-10-CM

## 2017-06-29 DIAGNOSIS — C787 Secondary malignant neoplasm of liver and intrahepatic bile duct: ICD-10-CM

## 2017-06-29 DIAGNOSIS — I10 Essential (primary) hypertension: ICD-10-CM

## 2017-06-29 DIAGNOSIS — F329 Major depressive disorder, single episode, unspecified: ICD-10-CM

## 2017-06-29 DIAGNOSIS — F419 Anxiety disorder, unspecified: ICD-10-CM

## 2017-06-29 DIAGNOSIS — F4323 Adjustment disorder with mixed anxiety and depressed mood: ICD-10-CM

## 2017-06-29 LAB — COMPREHENSIVE METABOLIC PANEL
Lab: 1.3 mg/dL — ABNORMAL HIGH (ref 0.4–1.24)
Lab: 1.5 mg/dL — ABNORMAL HIGH (ref 0.3–1.2)
Lab: 100 MMOL/L (ref 98–110)
Lab: 135 MMOL/L — ABNORMAL LOW (ref 137–147)
Lab: 153 U/L — ABNORMAL HIGH (ref 25–110)
Lab: 17 mg/dL (ref 7–25)
Lab: 226 mg/dL — ABNORMAL HIGH (ref 70–100)
Lab: 26 MMOL/L (ref 21–30)
Lab: 4.1 g/dL — ABNORMAL LOW (ref 3.5–5.0)
Lab: 4.3 MMOL/L — ABNORMAL HIGH (ref 3.5–5.1)
Lab: 50 U/L — ABNORMAL HIGH (ref 7–40)
Lab: 7.6 g/dL (ref 6.0–8.0)
Lab: 9.6 mg/dL — ABNORMAL LOW (ref 8.5–10.6)

## 2017-06-29 LAB — CBC AND DIFF
Lab: 5.4 M/UL (ref 4.4–5.5)
Lab: 8.8 10*3/uL (ref 4.5–11.0)

## 2017-06-29 LAB — FREE T4-FREE THYROXINE: Lab: 2 ng/dL — ABNORMAL HIGH (ref 0.6–1.6)

## 2017-06-29 LAB — TSH WITH FREE T4 REFLEX: Lab: 41 uU/mL — ABNORMAL HIGH (ref 0.35–5.00)

## 2017-06-29 MED ORDER — SODIUM CHLORIDE 0.9 % IV SOLP
1000 mL | Freq: Once | INTRAVENOUS | 0 refills | Status: CP
Start: 2017-06-29 — End: ?
  Administered 2017-06-29: 19:00:00 1000 mL via INTRAVENOUS

## 2017-06-29 MED ORDER — ALPRAZOLAM 0.5 MG PO TAB
.25-.5 mg | ORAL_TABLET | Freq: Every day | ORAL | 1 refills | Status: AC | PRN
Start: 2017-06-29 — End: 2017-09-21

## 2017-06-29 MED ORDER — TRAZODONE 100 MG PO TAB
ORAL_TABLET | 1 refills | Status: AC | PRN
Start: 2017-06-29 — End: 2017-09-21

## 2017-06-29 MED ORDER — HEPARIN, PORCINE (PF) 100 UNIT/ML IV SYRG
500 [IU] | Freq: Once | INTRAVENOUS | 0 refills | Status: CP
Start: 2017-06-29 — End: ?
  Administered 2017-06-29: 21:00:00 500 [IU] via INTRAVENOUS

## 2017-06-29 MED ORDER — SODIUM CHLORIDE 0.9 % IV SOLP
1000 mL | Freq: Once | INTRAVENOUS | 0 refills | Status: CN
Start: 2017-06-29 — End: ?

## 2017-06-29 NOTE — Progress Notes
Subjective:       Willie Sandoval is a 71 y/o white male with a past psychiatric history of anxiety, depression, and alcohol dependence who presented to clinic for schedule follow up.  Marland Kitchen   ???  He presents to clinic today with his wife who was present for the duration of the visit with his permission.    ???  Since his last visit, he reports that he is doing ok.  He had to change his chemotherapy due to the tumors no longer responding to the trial drug.  Has been having some side effects from the switch.  Tolerating medications without issue.  His mood is stable.  His anxiety is also stable at this time.  Denies SI/HI/AH/VH.  Continues to engage in therapy with benefit.  ???  Social history review:  Lives at home with his wife. Retired. Has several hobbies including working restoring the [employer].   Denies tobacco and drug use.  Drinking less overall.  No longer hiding it.      Willie Sandoval is a 71 y.o. male.       Review of Systems   Constitutional: Negative for fatigue.   Gastrointestinal:        Has GIST   Musculoskeletal: Positive for arthralgias, back pain and gait problem.   Psychiatric/Behavioral: Positive for sleep disturbance. Negative for agitation, behavioral problems, confusion, decreased concentration, dysphoric mood, hallucinations, self-injury and suicidal ideas. The patient is nervous/anxious. The patient is not hyperactive.          Objective:         ??? acetaminophen (TYLENOL) 500 mg tablet Take 500 mg by mouth every 6 hours as needed for Pain. Max of 4,000 mg of acetaminophen in 24 hours.   ??? ALPRAZolam (XANAX) 0.5 mg tablet Take 0.5-1 tablets by mouth daily as needed for Anxiety.   ??? aspirin EC 81 mg tablet Take 81 mg by mouth daily.   ??? BIFIDOBACTERIUM INFANTIS (ALIGN PO) Take 1 capsule by mouth. 5 billion cell capsule   ??? Biotin 10,000 mcg cap Take 1 capsule by mouth daily.   ??? CELECOXIB (CELEBREX PO) Take 1 tablet by mouth daily. ??? cyanocobalamin(+) (VITAMIN B-12) 500 mcg tablet Take 500 mcg by mouth daily.   ??? cyclobenzaprine (FLEXERIL) 10 mg tablet Take 1 tablet by mouth twice daily as needed for Muscle Cramps.   ??? ferrous sulfate (FEOSOL, FEROSUL) 325 mg (65 mg iron) tablet Take 325 mg by mouth daily. Take on an empty stomach at least 1 hour before or 2 hours after food.   ??? fish oil /omega-3 fatty acids (SEA-OMEGA) 340/1000 mg capsule Take 1 Cap by mouth daily.   ??? furosemide (LASIX) 40 mg tablet Take 40 mg by mouth daily.   ??? gabapentin (NEURONTIN) 400 mg capsule TAKE 2 CAPSULES BY MOUTH TWICE A DAY   ??? Garlic 1,000 mg cap Take 1 Cap by mouth daily.   ??? GLUCOSAMINE SULF/CHONDROITIN A (GLUCOSAMINE SULF-CHONDROITINSA PO) Take 1 tablet by mouth twice daily.   ??? levothyroxine (SYNTHROID) 50 mcg tablet TAKE 1 TABLET BY MOUTH DAILY 30 MINUTES BEFORE BREAKFAST.   ??? lidocaine viscous-diphenhydrAMINE-alum/mag hydroxide/simeth 1:1:1 suspension Swish and Spit 10 mL by mouth as directed before meals and at bedtime.   ??? lidocaine/prilocaine (EMLA) 2.5/2.5 % topical cream Apply to port area 30 minutes prior to access   ??? lisinopril (PRINIVIL; ZESTRIL) 5 mg tablet Take 5 mg by mouth daily.   ??? magnesium oxide (MAG-OX) 400 mg tablet Take  400 mg by mouth daily.   ??? metoprolol (LOPRESSOR) 25 mg tablet Take 25 mg by mouth twice daily.   ??? MV with Min-Lycopene-Lutein (CENTRUM SILVER) 0.4-300-250 mg-mcg-mcg tab Take 1 Tab by mouth daily.   ??? ondansetron (ZOFRAN) 8 mg tablet Take 1 tablet by mouth every 8 hours as needed for Nausea.   ??? oxymetazoline (AFRIN) 0.05 % nasal spray Apply 1 spray to each nostril as directed twice daily as needed.   ??? potassium chloride SR (K-DUR) 20 mEq tablet Take 20 mEq by mouth daily. Take with a meal and a full glass of water.   ??? ranitidine(+) (ZANTAC) 300 mg tablet Take 300 mg by mouth daily.   ??? regorafenib (STIVARGA) 40 mg tablet Take 4 tabs by mouth daily on Days 1-21 of each 28-day cycle. Take at the same time each day with a low fat meal.   ??? sertraline (ZOLOFT) 100 mg tablet Take 2 tablets by mouth daily. 90 day supply   ??? simethicone (MYLICON) 80 mg chew tablet Take 1 Tab by mouth every 6 hours as needed. (Patient taking differently: Chew 80-160 mg by mouth daily as needed.)   ??? sucralfate (CARAFATE) 1 gram tablet Take 1 g by mouth daily as needed (as direc). Take on an empty stomach.   ??? tamsulosin (FLOMAX) 0.4 mg capsule Take 1 Cap by mouth daily after breakfast.   ??? traMADol (ULTRAM) 50 mg tablet 1-2 tablets every 6 hours as needed for pain   ??? traZODone (DESYREL) 100 mg tablet Take 50 mg qhs.  May repeat an additional 50 mg qhs prn if not asleep in 1 hour.  90 day supply   ??? triamcinolone (NASACORT) 55 mcg nasal inhaler Apply 2 Sprays to each nostril as directed at bedtime as needed.   ??? vitamin E 400 unit capsule Take 400 Units by mouth daily.     Vitals:    06/29/17 1109   BP: 133/83   Pulse: 81   Height: 190.5 cm (75)     There is no height or weight on file to calculate BMI.     Physical Exam   Psychiatric:   General/Constitutional: 71 y/o white male who appears his stated age.  Dressed in casual attire with appropriate grooming and hygiene.  Eye Contact: good  Behavior: calm and cooperative.  Pleasant to talk to  Speech: normal rate, rhythm, volume, and tone  Mood: good  Affect: Euthymic.  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    - Continue Sertraline 200 mg daily.  - Continue Trazodone 50-100 mg qhs prn for sleep.  - Continue Gabapentin.    Continue Alprazolam 0.25-0.5 mg daily as needed for anxiety with travel and procedures.  Instructed him to not mix Xanax and alcohol.  He and his wife expressed understanding.    - Encouraged continued use of CPAP machine.    -  Continue therapy with Dr. Deloria Lair.    Call clinic with questions or concerns.  Should suicidal thoughts, intentions or plans or homicidal thoughts, intentions or plans develop and/or worsen, please utilize one of the following resources including 911, ER, mental health provider or crisis hotline.    The proposed treatment plan was discussed with the patient/guardian who was provided the opportunity to ask questions and make suggestions regarding alternative treatment.     Return to clinic in 12 weeks.

## 2017-06-29 NOTE — Progress Notes
Labs drawn from port.    Patient c/o mouth sores - feels "raw".    Seen by Almyra Free, NP for full eval, added on for IVF.    1L NS given per orders.  Discharged to wife in good condition, ambulating with cane.
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
## 2017-07-01 ENCOUNTER — Encounter: Admit: 2017-07-01 | Discharge: 2017-07-01 | Payer: MEDICARE

## 2017-07-01 DIAGNOSIS — E039 Hypothyroidism, unspecified: ICD-10-CM

## 2017-07-01 DIAGNOSIS — R7989 Other specified abnormal findings of blood chemistry: Principal | ICD-10-CM

## 2017-07-01 LAB — TOTAL T3 (TRIIODOTHYRONINE): Lab: 263 ng/dL — ABNORMAL HIGH (ref 87–180)

## 2017-07-01 NOTE — Telephone Encounter
Thyroid labs continue to be abnormal, more abnormal.  I have added total T3 to specimen in lab.

## 2017-07-03 ENCOUNTER — Encounter: Admit: 2017-07-03 | Discharge: 2017-07-03 | Payer: MEDICARE

## 2017-07-03 DIAGNOSIS — F411 Generalized anxiety disorder: Principal | ICD-10-CM

## 2017-07-03 DIAGNOSIS — F4323 Adjustment disorder with mixed anxiety and depressed mood: ICD-10-CM

## 2017-07-03 MED ORDER — SERTRALINE 100 MG PO TAB
ORAL_TABLET | Freq: Every day | 1 refills
Start: 2017-07-03 — End: ?

## 2017-07-06 ENCOUNTER — Encounter: Admit: 2017-07-06 | Discharge: 2017-07-06 | Payer: MEDICARE

## 2017-07-06 NOTE — Telephone Encounter
I called and left VM for pt.  I will also send this info via Mychart.  We discussed his very unusual labs today at case conference.    Plan:   stop levothyroxine  Check free T4 by equilibrium dialysis (will place order as misc lab)  Check dilution TSH (will place order as misc lab).  I have asked him to do these things.

## 2017-07-06 NOTE — Progress Notes
We discussed the patient's labs today in Endocrinology case conference.  Labs are quite unusual and do not fit a typical profile for a patient  being treated with any of his current oncologic medications.  He is currently taking Opdivo and Yervoy both can cause abnormal thyroid labs and/or function.  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  This would be highly unlikely considering that his labs became  abnormal over a few month period of time.    Suggestions for further workup included serum free T4 by equilibrium dialysis and obtaining dilution of TSH, 1: 10 and 1: 100.  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  Another suggestion was to get the patient to have labs drawn in a different laboratory such as [facility] or [facility].  If there is an antibody or another substance interfering it would be unlikely to be occurring at another lab as well.    He was also suggested to discuss case with Dr. Sylvan Cheese.  I await a call back from him regarding this.  In the meantime I have asked the patient to stop taking levothyroxine.

## 2017-07-07 ENCOUNTER — Encounter: Admit: 2017-07-07 | Discharge: 2017-07-07 | Payer: MEDICARE

## 2017-07-07 ENCOUNTER — Encounter: Admit: 2017-07-07 | Discharge: 2017-07-08 | Payer: MEDICARE

## 2017-07-07 DIAGNOSIS — C171 Malignant neoplasm of jejunum: ICD-10-CM

## 2017-07-07 DIAGNOSIS — I10 Essential (primary) hypertension: ICD-10-CM

## 2017-07-07 DIAGNOSIS — K1231 Oral mucositis (ulcerative) due to antineoplastic therapy: ICD-10-CM

## 2017-07-07 DIAGNOSIS — T451X5A Adverse effect of antineoplastic and immunosuppressive drugs, initial encounter: ICD-10-CM

## 2017-07-07 DIAGNOSIS — F329 Major depressive disorder, single episode, unspecified: ICD-10-CM

## 2017-07-07 DIAGNOSIS — C49A3 Gastrointestinal stromal tumor of small intestine: Principal | ICD-10-CM

## 2017-07-07 DIAGNOSIS — C787 Secondary malignant neoplasm of liver and intrahepatic bile duct: ICD-10-CM

## 2017-07-07 DIAGNOSIS — M1990 Unspecified osteoarthritis, unspecified site: ICD-10-CM

## 2017-07-07 DIAGNOSIS — L271 Localized skin eruption due to drugs and medicaments taken internally: ICD-10-CM

## 2017-07-07 DIAGNOSIS — F419 Anxiety disorder, unspecified: ICD-10-CM

## 2017-07-07 DIAGNOSIS — Z9221 Personal history of antineoplastic chemotherapy: ICD-10-CM

## 2017-07-07 DIAGNOSIS — M7030 Other bursitis of elbow, unspecified elbow: ICD-10-CM

## 2017-07-07 DIAGNOSIS — E785 Hyperlipidemia, unspecified: ICD-10-CM

## 2017-07-07 LAB — CBC AND DIFF
Lab: 0.1 10*3/uL (ref 0–0.20)
Lab: 11 10*3/uL — ABNORMAL HIGH (ref 4.5–11.0)
Lab: 5 M/UL (ref 4.4–5.5)

## 2017-07-07 MED ORDER — HEPARIN, PORCINE (PF) 100 UNIT/ML IV SYRG
500 [IU] | Freq: Once | INTRAVENOUS | 0 refills | Status: CP
Start: 2017-07-07 — End: ?
  Administered 2017-07-07: 20:00:00 500 [IU] via INTRAVENOUS

## 2017-07-07 NOTE — Progress Notes
Labs drawn from port and port flushed per protocol.    Seen by Latricia Heft, NP for full eval.

## 2017-07-07 NOTE — Progress Notes
Oral Chemotherapy Pharmacist Note    Mr. Voorhees is due for a C1 toxicity check, however medication is on hold due to adverse effects of hand/foot syndrome and mouth sores. Reassessment pushed back 2 weeks.
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
## 2017-07-08 LAB — COMPREHENSIVE METABOLIC PANEL
Lab: 1.1 mg/dL (ref 0.3–1.2)
Lab: 1.2 mg/dL — ABNORMAL HIGH (ref 0.4–1.24)
Lab: 100 MMOL/L (ref 98–110)
Lab: 129 mg/dL — ABNORMAL HIGH (ref 70–100)
Lab: 136 MMOL/L — ABNORMAL LOW (ref 137–147)
Lab: 158 U/L — ABNORMAL HIGH (ref 25–110)
Lab: 18 mg/dL (ref 7–25)
Lab: 27 MMOL/L (ref 21–30)
Lab: 4.2 g/dL (ref 3.5–5.0)
Lab: 4.6 MMOL/L (ref 3.5–5.1)
Lab: 41 U/L (ref 7–56)
Lab: 44 U/L — ABNORMAL HIGH (ref 7–40)
Lab: 57 mL/min — ABNORMAL LOW (ref >60)
Lab: 7.7 g/dL (ref 6.0–8.0)
Lab: 9 10*3/uL (ref 3–12)
Lab: 9.8 mg/dL (ref 8.5–10.6)
Lab: >60 mL/min — ABNORMAL HIGH (ref >60)

## 2017-07-11 NOTE — Telephone Encounter
Please call pt and let him know I spoke with the head of lab and he wants Dong to stop biotin for 3 days, then have labs drawn.  Biotin can interfere with some of these labs, which may be why they are odd.  Stop biotin today and plan for lab draw later this week or early next week.  I will place orders for these.  thanks

## 2017-07-11 NOTE — Telephone Encounter
Contacted pt's wife, [name], discussed if pt had received message about Zaydyn stopping the Levothyroxine and having labs completed.   [name] verbalized that Khalon had reviewed this message last week and stopped the levothyroxine on the 07/07/17  I discussed with [name] that I would call her back with a plan, regarding Micharl completing labs since he stopped the levothyroxine on the 26th, and Dr. Kalman Shan recommending stopping the biotin for 3 days prior to labs.    She verbalized understanding.

## 2017-07-11 NOTE — Telephone Encounter
Discussed conversation with pt's wife, [name], regarding LT4 dose, biotin and completing labs.  Dr. Kalman Shan verbalized, Dak is to restart LT4 now, stop biotin and have labs completed in next week when at [facility] for other appointments.    Contacted [name] back and plan of care for Remigio discussed.  She verbalized understanding of having Atiba restart the LT4, stop biotin and have labs completed next week  He will be at [place] with Dr. Edwin Dada on 07/21/17, will have labs them.    Routing to Dr. Kalman Shan so she is aware.

## 2017-07-13 ENCOUNTER — Encounter: Admit: 2017-07-13 | Discharge: 2017-07-13 | Payer: MEDICARE

## 2017-07-13 ENCOUNTER — Ambulatory Visit: Admit: 2017-07-13 | Discharge: 2017-07-14 | Payer: MEDICARE

## 2017-07-13 DIAGNOSIS — I10 Essential (primary) hypertension: ICD-10-CM

## 2017-07-13 DIAGNOSIS — F419 Anxiety disorder, unspecified: ICD-10-CM

## 2017-07-13 DIAGNOSIS — F329 Major depressive disorder, single episode, unspecified: ICD-10-CM

## 2017-07-13 DIAGNOSIS — Z9221 Personal history of antineoplastic chemotherapy: ICD-10-CM

## 2017-07-13 DIAGNOSIS — M1990 Unspecified osteoarthritis, unspecified site: ICD-10-CM

## 2017-07-13 DIAGNOSIS — C171 Malignant neoplasm of jejunum: ICD-10-CM

## 2017-07-13 DIAGNOSIS — E785 Hyperlipidemia, unspecified: ICD-10-CM

## 2017-07-13 DIAGNOSIS — M7030 Other bursitis of elbow, unspecified elbow: ICD-10-CM

## 2017-07-13 NOTE — Progress Notes
[facility] CLINIC NOTE  Subjective     SUBJECTIVE: [name] presents in follow-up for ongoing care regarding back and lower extremity.  Pain is in the bilateral lumbar region describes intimately cervical.  Walking standing exacerbate the pain pain is improved with rest.  He has undergone epidural steroid injections in the past with good benefit is interested in having one repeated.         Review of Systems   Constitutional: Positive for appetite change and fatigue.   HENT: Positive for hearing loss, sinus pressure, tinnitus and voice change.    Eyes: Positive for redness.   Respiratory: Positive for apnea.    Gastrointestinal: Positive for abdominal distention.   Musculoskeletal: Positive for arthralgias, back pain and gait problem.   Psychiatric/Behavioral: Positive for agitation and decreased concentration.   All other systems reviewed and are negative.      Current Outpatient Prescriptions:   ???  acetaminophen (TYLENOL) 500 mg tablet, Take 500 mg by mouth every 6 hours as needed for Pain. Max of 4,000 mg of acetaminophen in 24 hours., Disp: , Rfl:   ???  ALPRAZolam (XANAX) 0.5 mg tablet, Take 0.5-1 tablets by mouth daily as needed for Anxiety., Disp: 30 tablet, Rfl: 1  ???  aspirin EC 81 mg tablet, Take 81 mg by mouth daily., Disp: , Rfl:   ???  BIFIDOBACTERIUM INFANTIS (ALIGN PO), Take 1 capsule by mouth. 5 billion cell capsule, Disp: , Rfl:   ???  Biotin 10,000 mcg cap, Take 1 capsule by mouth daily., Disp: , Rfl:   ???  CELECOXIB (CELEBREX PO), Take 1 tablet by mouth daily., Disp: , Rfl:   ???  cyanocobalamin(+) (VITAMIN B-12) 500 mcg tablet, Take 500 mcg by mouth daily., Disp: , Rfl:   ???  cyclobenzaprine (FLEXERIL) 10 mg tablet, Take 1 tablet by mouth twice daily as needed for Muscle Cramps., Disp: 60 tablet, Rfl: 3  ???  ferrous sulfate (FEOSOL, FEROSUL) 325 mg (65 mg iron) tablet, Take 325 mg by mouth daily. Take on an empty stomach at least 1 hour before or 2 hours after food., Disp: , Rfl: ???  fish oil /omega-3 fatty acids (SEA-OMEGA) 340/1000 mg capsule, Take 1 Cap by mouth daily., Disp: , Rfl:   ???  furosemide (LASIX) 40 mg tablet, Take 40 mg by mouth daily., Disp: , Rfl:   ???  gabapentin (NEURONTIN) 400 mg capsule, TAKE 2 CAPSULES BY MOUTH TWICE A DAY, Disp: 360 capsule, Rfl: 1  ???  Garlic 1,000 mg cap, Take 1 Cap by mouth daily., Disp: , Rfl:   ???  GLUCOSAMINE SULF/CHONDROITIN A (GLUCOSAMINE SULF-CHONDROITINSA PO), Take 1 tablet by mouth twice daily., Disp: , Rfl:   ???  levothyroxine (SYNTHROID) 50 mcg tablet, TAKE 1 TABLET BY MOUTH DAILY 30 MINUTES BEFORE BREAKFAST., Disp: 30 tablet, Rfl: 0  ???  lidocaine viscous-diphenhydrAMINE-alum/mag hydroxide/simeth 1:1:1 suspension, Swish and Spit 10 mL by mouth as directed before meals and at bedtime., Disp: 300 mL, Rfl: 0  ???  lidocaine/prilocaine (EMLA) 2.5/2.5 % topical cream, Apply to port area 30 minutes prior to access, Disp: 30 g, Rfl: 2  ???  lisinopril (PRINIVIL; ZESTRIL) 5 mg tablet, Take 5 mg by mouth daily., Disp: , Rfl:   ???  magnesium oxide (MAG-OX) 400 mg tablet, Take 400 mg by mouth daily., Disp: , Rfl:   ???  metoprolol (LOPRESSOR) 25 mg tablet, Take 25 mg by mouth twice daily., Disp: , Rfl:   ???  MV with Min-Lycopene-Lutein (CENTRUM SILVER)  0.4-300-250 mg-mcg-mcg tab, Take 1 Tab by mouth daily., Disp: , Rfl:   ???  ondansetron (ZOFRAN) 8 mg tablet, Take 1 tablet by mouth every 8 hours as needed for Nausea., Disp: 20 tablet, Rfl: 2  ???  oxymetazoline (AFRIN) 0.05 % nasal spray, Apply 1 spray to each nostril as directed twice daily as needed., Disp: , Rfl:   ???  potassium chloride SR (K-DUR) 20 mEq tablet, Take 20 mEq by mouth daily. Take with a meal and a full glass of water., Disp: , Rfl:   ???  ranitidine(+) (ZANTAC) 300 mg tablet, Take 300 mg by mouth daily., Disp: , Rfl:   ???  regorafenib (STIVARGA) 40 mg tablet, Take 4 tabs by mouth daily on Days 1-21 of each 28-day cycle. Take at the same time each day with a low fat meal., Disp: 84 tablet, Rfl: 0 ???  sertraline (ZOLOFT) 100 mg tablet, Take 2 tablets by mouth daily. 90 day supply, Disp: 180 tablet, Rfl: 1  ???  simethicone (MYLICON) 80 mg chew tablet, Take 1 Tab by mouth every 6 hours as needed. (Patient taking differently: Chew 80-160 mg by mouth daily as needed.), Disp: 30 Tab, Rfl: 0  ???  sucralfate (CARAFATE) 1 gram tablet, Take 1 g by mouth daily as needed (as direc). Take on an empty stomach., Disp: , Rfl:   ???  tamsulosin (FLOMAX) 0.4 mg capsule, Take 1 Cap by mouth daily after breakfast., Disp: 90 Cap, Rfl: 3  ???  traMADol (ULTRAM) 50 mg tablet, 1-2 tablets every 6 hours as needed for pain, Disp: 60 tablet, Rfl: 0  ???  traZODone (DESYREL) 100 mg tablet, Take 50 mg qhs.  May repeat an additional 50 mg qhs prn if not asleep in 1 hour.  90 day supply, Disp: 90 tablet, Rfl: 1  ???  triamcinolone (NASACORT) 55 mcg nasal inhaler, Apply 2 Sprays to each nostril as directed at bedtime as needed., Disp: , Rfl:   ???  vitamin E 400 unit capsule, Take 400 Units by mouth daily., Disp: , Rfl:   Allergies   Allergen Reactions   ??? Levaquin [Levofloxacin] ANAPHYLAXIS   ??? 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 Herbie Saxon     Physical Exam  Vitals:    07/13/17 1223   BP: 136/75   Pulse: 71   SpO2: 98%   Weight: 125.2 kg (276 lb)   Height: 190.5 cm (75)        Pain Score: Six  Body mass index is 34.5 kg/m???.  General: Alert and oriented, very pleasant male.   HEENT showed extraocular muscles were intact and no other abnormalities.  Unlabored breathing.  Regular rate and rhythm on CV exam.   5/5 strength in bilateral upper and lower extremities.    Sensation is intact to light touch and equal in the upper and lower extremities.  Bilateral lumbar tenderness to palpation       IMPRESSION:  1. Spondylosis of lumbosacral region without myelopathy or radiculopathy    2. Lumbar radiculopathy PLAN: We will schedule a bilateral L5-S1 transfemoral procedure injection of reducing back and leg pain

## 2017-07-14 ENCOUNTER — Encounter: Admit: 2017-07-14 | Discharge: 2017-07-14 | Payer: MEDICARE

## 2017-07-14 ENCOUNTER — Ambulatory Visit: Admit: 2017-07-14 | Discharge: 2017-07-15 | Payer: MEDICARE

## 2017-07-14 DIAGNOSIS — C171 Malignant neoplasm of jejunum: Principal | ICD-10-CM

## 2017-07-14 DIAGNOSIS — F419 Anxiety disorder, unspecified: ICD-10-CM

## 2017-07-14 DIAGNOSIS — M1990 Unspecified osteoarthritis, unspecified site: ICD-10-CM

## 2017-07-14 DIAGNOSIS — Z9221 Personal history of antineoplastic chemotherapy: ICD-10-CM

## 2017-07-14 DIAGNOSIS — I10 Essential (primary) hypertension: ICD-10-CM

## 2017-07-14 DIAGNOSIS — M7030 Other bursitis of elbow, unspecified elbow: ICD-10-CM

## 2017-07-14 DIAGNOSIS — E785 Hyperlipidemia, unspecified: ICD-10-CM

## 2017-07-14 DIAGNOSIS — M5416 Radiculopathy, lumbar region: Principal | ICD-10-CM

## 2017-07-14 DIAGNOSIS — F329 Major depressive disorder, single episode, unspecified: ICD-10-CM

## 2017-07-14 DIAGNOSIS — M47817 Spondylosis without myelopathy or radiculopathy, lumbosacral region: Principal | ICD-10-CM

## 2017-07-14 MED ORDER — MIDAZOLAM 1 MG/ML IJ SOLN
2 mg | Freq: Once | INTRAVENOUS | 0 refills | Status: CP
Start: 2017-07-14 — End: ?
  Administered 2017-07-14: 15:00:00 2 mg via INTRAVENOUS

## 2017-07-14 MED ORDER — IOPAMIDOL 41 % IT SOLN
2.5 mL | Freq: Once | EPIDURAL | 0 refills | Status: CP
Start: 2017-07-14 — End: ?
  Administered 2017-07-14: 15:00:00 2.5 mL via EPIDURAL

## 2017-07-14 MED ORDER — FENTANYL CITRATE (PF) 50 MCG/ML IJ SOLN
50 ug | Freq: Once | INTRAVENOUS | 0 refills | Status: CP
Start: 2017-07-14 — End: ?
  Administered 2017-07-14: 15:00:00 50 ug via INTRAVENOUS

## 2017-07-14 MED ORDER — HEPARIN, PORCINE (PF) 100 UNIT/ML IV SYRG
500 [IU] | Freq: Once | INTRAVENOUS | 0 refills | Status: CP
Start: 2017-07-14 — End: ?
  Administered 2017-07-14: 16:00:00 500 [IU] via INTRAVENOUS

## 2017-07-14 MED ORDER — TRIAMCINOLONE ACETONIDE 40 MG/ML IJ SUSP
80 mg | Freq: Once | EPIDURAL | 0 refills | Status: CP
Start: 2017-07-14 — End: ?
  Administered 2017-07-14: 15:00:00 80 mg via EPIDURAL

## 2017-07-14 NOTE — Procedures
Attending Surgeon: Keshawn Sundberg B Waynette Towers, MD    Anesthesia: Local    Pre-Procedure Diagnosis:   1. Lumbar disc disease with radiculopathy    2. Lumbar radicular pain        Post-Procedure Diagnosis:   1. Lumbar disc disease with radiculopathy    2. Lumbar radicular pain        SNR/TF LMBR/SAC  Procedure: transforaminal epidural    Laterality: bilateral  Location: lumbar - L5-S1      Consent:   Consent obtained: written  Consent given by: patient  Risks discussed: allergic reaction, bleeding, infection, weakness and no change or worsening in pain  Alternatives discussed: alternative treatment     Universal Protocol:  Relevant documents: relevant documents present and verified  Test results: test results available and properly labeled  Imaging studies: imaging studies available  Required items: required blood products, implants, devices, and special equipment available  Site marked: the operative site was marked  Patient identity confirmed: Patient identify confirmed verbally with patient.      Time out: Immediately prior to procedure a "time out" was called to verify the correct patient, procedure, equipment, support staff and site/side marked as required      Procedures Details:   Indications: pain   Prep: chlorhexidine  Patient position: prone  Estimated Blood Loss: minimal  Specimens: none  Number of Levels: 1  Guidance: fluoroscopy  Needle size: 25 G  Injection procedure: Negative aspiration for blood  Patient tolerance: Patient tolerated the procedure well with no immediate complications. Pressure was applied, and hemostasis was accomplished.  Outcome: Pain improved  Comments: Kenalog 40 mg was injected at each location      Estimated blood loss: none or minimal  Specimens: none  Patient tolerated the procedure well with no immediate complications. Pressure was applied, and hemostasis was accomplished.

## 2017-07-14 NOTE — Progress Notes
Sedation physician present in room.  Recent vitals and patient condition reviewed between sedating physician and nurse.  Reassessment completed.  Determination made to proceed with planned sedation.

## 2017-07-14 NOTE — Progress Notes
[facility]  INTERVENTIONAL PAIN PROCEDURE HISTORY AND PHYSICAL    Chief Complaint   Patient presents with   ??? Procedure     tfesi       HISTORY OF PRESENT ILLNESS:  Bilateral lumbar pain, presents for spinal injection    Past Medical History:   Diagnosis Date   ??? Anxiety disorder    ??? Arthritis    ??? Bursitis of elbow    ??? Depression    ??? Hyperlipidemia    ??? Hypertension    ??? 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 Personal history of antineoplastic chemotherapy 2009    received Gleevec 12/08 to 12/09       Past Surgical History:   Procedure Laterality Date   ??? 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 SONO GUIDANCE/NEEDLE BIOPSY  05/11/12    abdominal mass core biopsy, Gastrointestinal Stromal tumor   ??? CT HISTORICAL REPORT  03/15/13    CTabdomin- continued slight decrease in size of the dominant mesenteric mass and small mesenteric nodules compatible with favorable response to tx, stable exophytic L renal lesion   ??? UPPER GASTROINTESTINAL ENDOSCOPY N/A 03/20/2015    ESOPHAGOGASTRODUODENOSCOPY performed by Raynelle Chary, MD at [facility]GI   ??? ABDOMINAL EXPLORATION SURGERY N/A 06/20/2015    LAPAROTOMY EXPLORATORY, LYSIS OF ADHESIONS, POSSIBLE SMALL BOWEL RESECTION, POSSIBLE OSTOMY, SCAR REVISION performed by Genia Del, MD at [facility]   ??? HX APPENDECTOMY     ??? HX CHOLECYSTECTOMY     ??? HX MASTECTOMY      history of gynecomastia   ??? HX SURGERY      excision of intestinal tumor   ??? LUMBAR SPINE SURGERY      aspen device   ??? MASTECTOMY Bilateral     6th grade   ??? TONSIL AND ADENOIDECTOMY     ??? VASECTOMY         family history includes Alcohol abuse in his maternal uncle and maternal uncle; Cancer-Breast in his maternal grandmother and mother; Cancer-Colon in his maternal grandmother; Cancer-Prostate in his father.    Social History     Social History   ??? Marital status: Married Spouse name: N/A   ??? Number of children: N/A   ??? Years of education: N/A     Occupational History   ??? Not on file.     Social History Main Topics   ??? Smoking status: Former Smoker     Packs/day: 1.00     Years: 45.00     Types: Cigarettes     Start date: 08/23/1957     Quit date: 08/23/2006   ??? Smokeless tobacco: Never Used   ??? Alcohol use 7.0 oz/week     14 Standard drinks or equivalent per week   ??? Drug use: No   ??? Sexual activity: Yes     Partners: Female     Other Topics Concern   ??? Not on file     Social History Narrative   ??? No narrative on file       Allergies   Allergen Reactions   ??? Levaquin [Levofloxacin] ANAPHYLAXIS   ??? 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 [employer]  Vitals:    07/14/17 0922   BP: 133/72   Pulse: 62   Temp: 36.4 ???C (97.6 ???F)   TempSrc: Oral   SpO2: 98%   Weight: 125.2 kg (276 lb)   Height: 190.5 cm (75)       REVIEW OF SYSTEMS: 10 point ROS obtained and negative except for back and leg pain      PHYSICAL EXAM:  General: Alert and oriented, very pleasant male.   HEENT showed extraocular muscles were intact and no other abnormalities.  Unlabored breathing.  Regular rate and rhythm on CV exam.   5/5 strength in bilateral upper and lower extremities.    Sensation is intact to light touch and equal in the upper and lower extremities.    Bilateral lumbar tenderness to palpation    IMPRESSION:    1. Lumbar disc disease with radiculopathy    2. Lumbar radicular pain         PLAN: Lumbar Transforaminal Steroid Injection at bilateral L5-S1    General Pre Procedural Sedation ASA Classification      I have discussed risks and alternatives of this type of sedation and procedure with: patient    NPO Status:Acceptable    Pregnancy Status: No    Prior Anesthetic Types: General, Deep sedation, Moderate sedation and Anxiolysis    Patient's had previous experience with anesthesia and/or sedation complications: No Family history of sedation complications: No    Airway: Airway assessment performed II (soft palate, uvula, fauces visible)    Head and Neck: No abnormalities noted    Mouth: No abnormalities noted    Medications for Procedural Sedation: Midazolam and Fentanyl    Anesthesia Classification:  ASA II (A normal patient with mild systemic disease)    Patient remains a candidate for procedure: Yes    The intention for the procedure today is Anxiolysis/Analgesia.

## 2017-07-15 ENCOUNTER — Ambulatory Visit: Admit: 2017-07-14 | Discharge: 2017-07-15 | Payer: MEDICARE

## 2017-07-15 DIAGNOSIS — R45851 Suicidal ideations: ICD-10-CM

## 2017-07-15 DIAGNOSIS — F4323 Adjustment disorder with mixed anxiety and depressed mood: ICD-10-CM

## 2017-07-15 DIAGNOSIS — E785 Hyperlipidemia, unspecified: ICD-10-CM

## 2017-07-15 DIAGNOSIS — M5116 Intervertebral disc disorders with radiculopathy, lumbar region: Principal | ICD-10-CM

## 2017-07-15 DIAGNOSIS — Z85068 Personal history of other malignant neoplasm of small intestine: ICD-10-CM

## 2017-07-15 DIAGNOSIS — M5416 Radiculopathy, lumbar region: ICD-10-CM

## 2017-07-15 DIAGNOSIS — C49A4 Gastrointestinal stromal tumor of large intestine: ICD-10-CM

## 2017-07-15 DIAGNOSIS — I10 Essential (primary) hypertension: ICD-10-CM

## 2017-07-15 DIAGNOSIS — F1010 Alcohol abuse, uncomplicated: ICD-10-CM

## 2017-07-15 NOTE — Progress Notes
CONFIDENTIAL  Follow-up note    PATIENT: Willie Sandoval  DOB: [birth year]  DOS:07/14/2017  TIME: 8:05-8:55am  INTERVENTION: Supportive and CBT    SUMMARY:  Pt seen for follow-up psychotherapy.  Focus of discussion on patient's increase in anxiety related to pain, treatment side effects, and quality of life.  Patient noted internalizing these feelings because he doesn't want to overwhelm his spouse, whom he is also worried about.  He described passive suicidal thoughts last week when he was lethargic and swollen for 2 days related to his new cancer treatment.  Patient noted feeling better currently as the side effects decreased when he stopped the medication.  He denied having any active suicidal thoughts, plans, or intent.  Safety planning around these thoughts completed.    MSE/ASSESSMENT: Patient alert and Ox3. Speech fluid. Thoughts lucid and w/o evidence of psychoses. Memory grossly intact. Patient cooperative and with appropriate eye contact. Mood calm with congruent affect. Insight, judgment, and impulse control sufficient. No SI/HI, plans, or intent endorsed at this time.     IMPRESSIONS (based on DSM V): Alcohol abuse           Adjustment disorder with mixed anxiety and depressed mood            Medical Diagnosis GIST    PLAN: CBT    Vita Erm, PhD  Licensed Psychologist  [phone]

## 2017-07-21 ENCOUNTER — Encounter: Admit: 2017-07-21 | Discharge: 2017-07-22 | Payer: MEDICARE

## 2017-07-21 ENCOUNTER — Encounter: Admit: 2017-07-21 | Discharge: 2017-07-21 | Payer: MEDICARE

## 2017-07-21 DIAGNOSIS — E039 Hypothyroidism, unspecified: ICD-10-CM

## 2017-07-21 DIAGNOSIS — C49A3 Gastrointestinal stromal tumor of small intestine: Principal | ICD-10-CM

## 2017-07-21 DIAGNOSIS — R7989 Other specified abnormal findings of blood chemistry: Secondary | ICD-10-CM

## 2017-07-21 DIAGNOSIS — M1990 Unspecified osteoarthritis, unspecified site: ICD-10-CM

## 2017-07-21 DIAGNOSIS — Z9221 Personal history of antineoplastic chemotherapy: ICD-10-CM

## 2017-07-21 DIAGNOSIS — C787 Secondary malignant neoplasm of liver and intrahepatic bile duct: ICD-10-CM

## 2017-07-21 DIAGNOSIS — F329 Major depressive disorder, single episode, unspecified: ICD-10-CM

## 2017-07-21 DIAGNOSIS — C171 Malignant neoplasm of jejunum: Principal | ICD-10-CM

## 2017-07-21 DIAGNOSIS — I10 Essential (primary) hypertension: ICD-10-CM

## 2017-07-21 DIAGNOSIS — E785 Hyperlipidemia, unspecified: ICD-10-CM

## 2017-07-21 DIAGNOSIS — F419 Anxiety disorder, unspecified: ICD-10-CM

## 2017-07-21 DIAGNOSIS — M7030 Other bursitis of elbow, unspecified elbow: ICD-10-CM

## 2017-07-21 LAB — CBC AND DIFF
Lab: 10 10*3/uL (ref 4.5–11.0)
Lab: 16 g/dL — ABNORMAL HIGH (ref 13.5–16.5)
Lab: 49 % (ref 40–50)
Lab: 5.1 M/UL (ref 4.4–5.5)

## 2017-07-21 LAB — COMPREHENSIVE METABOLIC PANEL
Lab: 0.7 mg/dL (ref 0.3–1.2)
Lab: 10 K/UL (ref 3–12)
Lab: 101 MMOL/L (ref 98–110)
Lab: 137 MMOL/L (ref 137–147)
Lab: 154 mg/dL — ABNORMAL HIGH (ref 70–100)
Lab: 161 U/L — ABNORMAL HIGH (ref 25–110)
Lab: 19 mg/dL (ref 7–25)
Lab: 26 MMOL/L (ref 21–30)
Lab: 4.1 MMOL/L (ref 3.5–5.1)
Lab: 4.3 g/dL (ref 3.5–5.0)
Lab: 40 U/L (ref 7–56)
Lab: 45 U/L — ABNORMAL HIGH (ref 7–40)
Lab: 8.1 g/dL — ABNORMAL HIGH (ref 6.0–8.0)
Lab: 9.6 mg/dL (ref 8.5–10.6)
Lab: >60 mL/min (ref >60)
Lab: >60 mL/min (ref >60)

## 2017-07-21 LAB — THYROID STIMULATING HORMONE-TSH: Lab: 41 uU/mL — ABNORMAL HIGH (ref 0.35–5.00)

## 2017-07-21 LAB — TOTAL T3 (TRIIODOTHYRONINE): Lab: 89 ng/dL (ref 87–180)

## 2017-07-21 LAB — FREE T4 (FREE THYROXINE) ONLY: Lab: 0.6 ng/dL (ref 0.6–1.6)

## 2017-07-21 MED ORDER — HEPARIN, PORCINE (PF) 100 UNIT/ML IV SYRG
500 [IU] | Freq: Once | INTRAVENOUS | 0 refills | Status: CP
Start: 2017-07-21 — End: ?
  Administered 2017-07-21: 16:00:00 500 [IU] via INTRAVENOUS

## 2017-07-21 NOTE — Progress Notes
Oral Chemotherapy Pharmacist Note    Willie Sandoval is resuming regorafenib now at a 50% dose reduction. Toxicity check in 2 weeks.

## 2017-07-21 NOTE — Progress Notes
Pt here for ND/PF.  Port accessed easily with positive blood return when pt supine.  Dismissed ambulatory from treatment room, accompanied, to be seen in follow up.
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
## 2017-07-22 ENCOUNTER — Encounter: Admit: 2017-07-22 | Discharge: 2017-07-22 | Payer: MEDICARE

## 2017-07-22 DIAGNOSIS — E039 Hypothyroidism, unspecified: Principal | ICD-10-CM

## 2017-07-22 MED ORDER — LEVOTHYROXINE 100 MCG PO TAB
100 ug | ORAL_TABLET | Freq: Every day | ORAL | 11 refills | 30.00000 days | Status: AC
Start: 2017-07-22 — End: 2017-08-23
  Filled 2017-07-22: qty 30, 30d supply

## 2017-07-27 ENCOUNTER — Encounter: Admit: 2017-07-27 | Discharge: 2017-07-27 | Payer: MEDICARE

## 2017-07-27 MED ORDER — TRAMADOL 50 MG PO TAB
ORAL_TABLET | Freq: Four times a day (QID) | 0 refills | Status: AC | PRN
Start: 2017-07-27 — End: 2017-08-23

## 2017-07-27 NOTE — Telephone Encounter
Patients wife called for refill for Micheal's tramodol.  RX redilled by Latricia Heft, script taken to front desk for patient to pick up this afternoon.

## 2017-08-01 ENCOUNTER — Encounter: Admit: 2017-08-01 | Discharge: 2017-08-01 | Payer: MEDICARE

## 2017-08-05 ENCOUNTER — Encounter: Admit: 2017-08-05 | Discharge: 2017-08-05 | Payer: MEDICARE

## 2017-08-05 ENCOUNTER — Encounter: Admit: 2017-08-05 | Discharge: 2017-08-06 | Payer: MEDICARE

## 2017-08-05 DIAGNOSIS — C787 Secondary malignant neoplasm of liver and intrahepatic bile duct: ICD-10-CM

## 2017-08-05 DIAGNOSIS — F419 Anxiety disorder, unspecified: ICD-10-CM

## 2017-08-05 DIAGNOSIS — Z9221 Personal history of antineoplastic chemotherapy: ICD-10-CM

## 2017-08-05 DIAGNOSIS — C49A3 Gastrointestinal stromal tumor of small intestine: Principal | ICD-10-CM

## 2017-08-05 DIAGNOSIS — F329 Major depressive disorder, single episode, unspecified: ICD-10-CM

## 2017-08-05 DIAGNOSIS — M7030 Other bursitis of elbow, unspecified elbow: ICD-10-CM

## 2017-08-05 DIAGNOSIS — E785 Hyperlipidemia, unspecified: ICD-10-CM

## 2017-08-05 DIAGNOSIS — M1990 Unspecified osteoarthritis, unspecified site: ICD-10-CM

## 2017-08-05 DIAGNOSIS — I10 Essential (primary) hypertension: ICD-10-CM

## 2017-08-05 DIAGNOSIS — C171 Malignant neoplasm of jejunum: ICD-10-CM

## 2017-08-05 LAB — COMPREHENSIVE METABOLIC PANEL
Lab: 0.6 mg/dL (ref 0.3–1.2)
Lab: 1.1 mg/dL (ref 0.4–1.24)
Lab: 101 MMOL/L (ref 98–110)
Lab: 137 MMOL/L (ref 137–147)
Lab: 167 U/L — ABNORMAL HIGH (ref 25–110)
Lab: 19 mg/dL (ref 7–25)
Lab: 213 mg/dL — ABNORMAL HIGH (ref 70–100)
Lab: 28 MMOL/L (ref 21–30)
Lab: 34 U/L — ABNORMAL HIGH (ref 7–56)
Lab: 37 U/L (ref 7–40)
Lab: 4.1 MMOL/L (ref 3.5–5.1)
Lab: 4.1 g/dL — ABNORMAL LOW (ref 3.5–5.0)
Lab: 7.4 g/dL (ref 6.0–8.0)
Lab: 8 10*3/uL (ref 3–12)
Lab: 9.6 mg/dL (ref 8.5–10.6)
Lab: >60 mL/min (ref >60)
Lab: >60 mL/min (ref >60)

## 2017-08-05 LAB — CBC AND DIFF
Lab: 0.1 10*3/uL (ref 0–0.20)
Lab: 11 10*3/uL — ABNORMAL HIGH (ref 4.5–11.0)
Lab: 5 M/UL (ref 4.4–5.5)

## 2017-08-05 NOTE — Progress Notes
Oral Chemotherapy Cycle 1 Toxicity Check    Summary of Therapy/Adherence Assessment    Willie Sandoval continues on regorafenib for the treatment of GIST.  Cycle 1 start date was in July of 2018. He restarted at a reduced dose of 2 tablets/day 2 weeks ago. [name] confirms he is taking the medication as prescribed - 2 tablets daily. He has 1 full bottle of medication left at this point.     [name] reports missing one or 2 doses over the past 2  week(s) not related to toxicity. He states it is somewhat difficult to remember to take at the correct time w/ meals, his wife then states that when he forgets to take it at the correct time he goes ahead and takes it when he remembers - so it sounds like this is more of an issue with timing than with actually forgetting to take the medication.     No dose adjustments based on toxicity are required at this time. He is tolerating the reduced dose much better than the full dose.     Treatment will continue until progression or unacceptable toxicity.        CBC w/Diff    Lab Results   Component Value Date/Time    WBC 11.2 (H) 08/05/2017 08:56 AM    RBC 5.03 08/05/2017 08:56 AM    HGB 15.9 08/05/2017 08:56 AM    HCT 47.3 08/05/2017 08:56 AM    MCV 94.0 08/05/2017 08:56 AM    MCH 31.6 08/05/2017 08:56 AM    MCHC 33.6 08/05/2017 08:56 AM    RDW 14.1 08/05/2017 08:56 AM    PLTCT 169 08/05/2017 08:56 AM    MPV 8.1 08/05/2017 08:56 AM    Lab Results   Component Value Date/Time    NEUT 72 08/05/2017 08:56 AM    ANC 8.00 (H) 08/05/2017 08:56 AM    LYMA 21 (L) 08/05/2017 08:56 AM    ALC 2.40 08/05/2017 08:56 AM    MONA 4 08/05/2017 08:56 AM    AMC 0.50 08/05/2017 08:56 AM    EOSA 2 08/05/2017 08:56 AM    AEC 0.20 08/05/2017 08:56 AM    BASA 1 08/05/2017 08:56 AM    ABC 0.10 08/05/2017 08:56 AM        Comprehensive Metabolic Profile    Lab Results   Component Value Date/Time    NA 137 07/21/2017 11:23 AM    K 4.1 07/21/2017 11:23 AM    CL 101 07/21/2017 11:23 AM CO2 26 07/21/2017 11:23 AM    GAP 10 07/21/2017 11:23 AM    BUN 19 07/21/2017 11:23 AM    CR 1.18 07/21/2017 11:23 AM    GLU 154 (H) 07/21/2017 11:23 AM    Lab Results   Component Value Date/Time    CA 9.6 07/21/2017 11:23 AM    PO4 3.9 06/27/2015 05:04 AM    ALBUMIN 4.3 07/21/2017 11:23 AM    TOTPROT 8.1 (H) 07/21/2017 11:23 AM    ALKPHOS 161 (H) 07/21/2017 11:23 AM    AST 45 (H) 07/21/2017 11:23 AM    ALT 40 07/21/2017 11:23 AM    TOTBILI 0.7 07/21/2017 11:23 AM    GFR >60 07/21/2017 11:23 AM    GFRAA >60 07/21/2017 11:23 AM              Serum creatinine: 1.18 mg/dL 16/10/96 0454  Estimated creatinine clearance: 83.1 mL/min    Adverse Effects Assessment    Casimiro Needle  D [place] is tolerating regorafenib 2 tablets daily well at this time. Hand foot syndrome resolved, and Dr. Benjiman Core counseled him on moisturization today. He does complain of some dryness in mouth and throat but now mouth sores.     Interaction Check    PHILIP TISBY reports the following medication changes since the last medication history during their education visit: discontinued biotind    Drug-drug and drug-food interactions between the patients??? specialty medication and their medication list were assessed.     No significant drug-drug interactions were identified.     The patient was instructed to speak with their health care provider and/or the oral chemotherapy pharmacist before starting any new drug, including prescription or over the counter, natural / herbal products, or vitamins.     Risk Evaluation and Mitigation Strategy (REMS) Assessment    No REMS is required for this medication.    Followup Plan     Re-assessment has been completed. Next reassessment planned for 3   month(s).     Rosemarie Ax, [facility]  Oncology Clinical Pharmacist  08/05/2017

## 2017-08-05 NOTE — Progress Notes
Labs drawn from port without difficulties.  He states that he is doing well, no new issues.  Discharged in good condition.
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
## 2017-08-10 ENCOUNTER — Encounter: Admit: 2017-08-10 | Discharge: 2017-08-10 | Payer: MEDICARE

## 2017-08-10 DIAGNOSIS — C49A3 Gastrointestinal stromal tumor of small intestine: Principal | ICD-10-CM

## 2017-08-10 MED ORDER — REGORAFENIB 40 MG PO TAB
ORAL_TABLET | Freq: Every day | ORAL | 0 refills | Status: DC
Start: 2017-08-10 — End: 2017-08-11

## 2017-08-10 MED ORDER — REGORAFENIB 40 MG PO TAB
ORAL_TABLET | Freq: Every day | 0 refills | Status: CN
Start: 2017-08-10 — End: ?

## 2017-08-10 MED ORDER — REGORAFENIB 40 MG PO TAB
ORAL_TABLET | Freq: Every day | ORAL | 0 refills | Status: DC
Start: 2017-08-10 — End: 2017-08-10

## 2017-08-10 NOTE — Oral Chemotherapy Note
Name:Willie Sandoval           MRN: [id number]                 DOB:11-02-1946          Age: 71 y.o.  Date of Service: 08/10/17      Oral Chemotherapy Refill Review     Patient Information:     Correct patient/DOB/Diagnosis: Yes    Reviewed most recent clinic notes for changes in plan: Yes    Most recent labs reviewed: Yes    Appointment Information:   Date of last appointment:  08/05/2017    Follow up appointment scheduled : Yes 08/18/2017      Medication Information:     Medication name: stivarga    Medication in [place]: Yes     Date of last refill released from [facility]: 05/23/17    Weight change >10%: No    Has a dose adjustment been made since last refill?Yes    Is Pharmacy listed in [place] treatment plan? Yes    Pharmacy: [facility]    Consent Date 05/17/17    Patient Contact  Method of communication: telephone    confirmed need for refill: Yes  Patient taking medication as directed Yes  Confirmation of receiving pharmacy: Yes  Patient is anticipating a start date of: 08/11/17    Other Information:     Plan was routed to [employer]

## 2017-08-11 ENCOUNTER — Encounter: Admit: 2017-08-11 | Discharge: 2017-08-11 | Payer: MEDICARE

## 2017-08-11 DIAGNOSIS — F4323 Adjustment disorder with mixed anxiety and depressed mood: ICD-10-CM

## 2017-08-11 DIAGNOSIS — C49A3 Gastrointestinal stromal tumor of small intestine: Principal | ICD-10-CM

## 2017-08-11 DIAGNOSIS — F1010 Alcohol abuse, uncomplicated: Secondary | ICD-10-CM

## 2017-08-11 MED ORDER — REGORAFENIB 40 MG PO TAB
ORAL_TABLET | Freq: Every day | ORAL | 0 refills | Status: AC
Start: 2017-08-11 — End: 2017-09-19

## 2017-08-15 NOTE — Progress Notes
CONFIDENTIAL  Follow-up note    PATIENT: Willie Sandoval  DOB: 1946/08/10  DOS:08/11/2017  TIME: 2:00-2:50pm  INTERVENTION: Supportive and CBT    SUMMARY:  Pt seen for follow-up psychotherapy.  Focus of discussion on patient's stabilization in mood.  He noted mild improvement in fatigue and but an increase in pain with his hands, feet, and mouth.  Patient noted that he has not been drinking at all due to mouth sores.  He still is worried about his wife's mood.  Discussed self-care and ways of assisting her.    MSE/ASSESSMENT: Patient alert and Ox3. Speech fluid. Thoughts lucid and w/o evidence of psychoses. Memory grossly intact. Patient cooperative and with appropriate eye contact. Mood calm with congruent affect. Insight, judgment, and impulse control sufficient. No SI/HI, plans, or intent endorsed at this time.     IMPRESSIONS (based on DSM V): Alcohol abuse           Adjustment disorder with mixed anxiety and depressed mood            Medical Diagnosis GIST    PLAN: CBT    Vita Erm, PhD  Licensed Psychologist  [phone]

## 2017-08-17 ENCOUNTER — Encounter: Admit: 2017-08-17 | Discharge: 2017-08-17 | Payer: MEDICARE

## 2017-08-17 NOTE — Progress Notes
Contacted Theatre stage manager.  They sent communication yesterday (08/16/17) regarding the patients regorafenib (Stivarga).  Our records indicate that we faxed a new prescription for this medication on 08/11/17.  However, [facility] has no record of this.  They were able to take a verbal prescription over the phone.      Of note, regorafenib is dispensed in quantities of 28-tablet packages.  The patient is currently taking regorafenib 80 mg (two 40 mg tablets) by mouth daily on days 1-21 of each 28-day cycle.  He only requires 42 tablets to complete a cycle but we wrote the script for 56 tablets (two 28-tablet quantities.)  The pharmacy and the patient are aware of this discrepancy.    The patient stated that he started his next cycle of regorafenib and had enough supply on hand for one week.  He ran out of medication sometime in the last 3-4 days.  He understands the importance of remaining on this medication.  [facility] is processing his script and should reach out to him today.  If [facility] does not hear from them regarding delivery, he will call them or call our clinic for support.    Ainsley Spinner, [facility]  Oncology Clinical Pharmacist  08/17/2017

## 2017-08-18 ENCOUNTER — Encounter: Admit: 2017-08-18 | Discharge: 2017-08-18 | Payer: MEDICARE

## 2017-08-18 ENCOUNTER — Encounter: Admit: 2017-08-18 | Discharge: 2017-08-19 | Payer: MEDICARE

## 2017-08-18 DIAGNOSIS — R32 Unspecified urinary incontinence: ICD-10-CM

## 2017-08-18 DIAGNOSIS — R300 Dysuria: ICD-10-CM

## 2017-08-18 DIAGNOSIS — M1990 Unspecified osteoarthritis, unspecified site: ICD-10-CM

## 2017-08-18 DIAGNOSIS — E039 Hypothyroidism, unspecified: ICD-10-CM

## 2017-08-18 DIAGNOSIS — C49A3 Gastrointestinal stromal tumor of small intestine: Principal | ICD-10-CM

## 2017-08-18 DIAGNOSIS — Z9221 Personal history of antineoplastic chemotherapy: ICD-10-CM

## 2017-08-18 DIAGNOSIS — M47817 Spondylosis without myelopathy or radiculopathy, lumbosacral region: Principal | ICD-10-CM

## 2017-08-18 DIAGNOSIS — F419 Anxiety disorder, unspecified: ICD-10-CM

## 2017-08-18 DIAGNOSIS — M7030 Other bursitis of elbow, unspecified elbow: ICD-10-CM

## 2017-08-18 DIAGNOSIS — I10 Essential (primary) hypertension: ICD-10-CM

## 2017-08-18 DIAGNOSIS — F329 Major depressive disorder, single episode, unspecified: ICD-10-CM

## 2017-08-18 DIAGNOSIS — E785 Hyperlipidemia, unspecified: ICD-10-CM

## 2017-08-18 DIAGNOSIS — C171 Malignant neoplasm of jejunum: Principal | ICD-10-CM

## 2017-08-18 LAB — URINALYSIS MICROSCOPIC REFLEX TO CULTURE

## 2017-08-18 LAB — URINALYSIS DIPSTICK REFLEX TO CULTURE
Lab: NEGATIVE
Lab: NEGATIVE
Lab: POSITIVE — AB

## 2017-08-18 LAB — COMPREHENSIVE METABOLIC PANEL
Lab: 100 MMOL/L (ref 98–110)
Lab: 134 MMOL/L — ABNORMAL LOW (ref 137–147)
Lab: 134 mg/dL — ABNORMAL HIGH (ref 70–100)
Lab: 4.4 MMOL/L (ref 3.5–5.1)

## 2017-08-18 LAB — CBC AND DIFF
Lab: 12 10*3/uL — ABNORMAL HIGH (ref 4.5–11.0)
Lab: 4.5 M/UL (ref 4.4–5.5)

## 2017-08-18 MED ORDER — HEPARIN, PORCINE (PF) 100 UNIT/ML IV SYRG
500 [IU] | Freq: Once | INTRAVENOUS | 0 refills | Status: CP
Start: 2017-08-18 — End: ?
  Administered 2017-08-18: 18:00:00 500 [IU] via INTRAVENOUS

## 2017-08-18 MED ORDER — CYCLOBENZAPRINE 10 MG PO TAB
10 mg | ORAL_TABLET | Freq: Two times a day (BID) | ORAL | 3 refills | 21.00000 days | Status: AC | PRN
Start: 2017-08-18 — End: 2018-03-08

## 2017-08-18 NOTE — Progress Notes
PT here for port flush, lab draw.  Pt states he feels well.  Port accessed, flushed.   Brisk blood return noted, labs drawn.  Port packed with heparin and deaccessed.  Pt tolerated well.  Pt discharged in stable, ambulatory condition for follow up appt.
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
## 2017-08-19 ENCOUNTER — Encounter: Admit: 2017-08-19 | Discharge: 2017-08-19 | Payer: MEDICARE

## 2017-08-19 LAB — FREE T4-FREE THYROXINE: Lab: 0.8 ng/dL (ref 0.6–1.6)

## 2017-08-19 LAB — TSH WITH FREE T4 REFLEX: Lab: 10 uU/mL — ABNORMAL HIGH (ref 0.35–5.00)

## 2017-08-19 MED ORDER — SULFAMETHOXAZOLE-TRIMETHOPRIM 800-160 MG PO TAB
1 | ORAL_TABLET | Freq: Two times a day (BID) | ORAL | 0 refills | Status: AC
Start: 2017-08-19 — End: 2017-09-01

## 2017-08-19 NOTE — Progress Notes
Willie Sandoval was in our clinic yesterday and saw our NP Latricia Heft.  Patient was complaining of urgency and enuresis.  Patient left urine for UA which was sent for culture.  I showed the results to Dr. Edwin Dada and he ordered Bactrim DS bid for 5 days.  Called and spoke with patients wife [name] and informed her of UA results.  Script sent to [facility] in [place]. Informed [name] that we would call when culture results were available.

## 2017-08-21 ENCOUNTER — Encounter: Admit: 2017-08-21 | Discharge: 2017-08-21 | Payer: MEDICARE

## 2017-08-23 ENCOUNTER — Encounter: Admit: 2017-08-23 | Discharge: 2017-08-23 | Payer: MEDICARE

## 2017-08-23 ENCOUNTER — Ambulatory Visit: Admit: 2017-08-23 | Discharge: 2017-08-23 | Payer: MEDICARE

## 2017-08-23 DIAGNOSIS — E785 Hyperlipidemia, unspecified: ICD-10-CM

## 2017-08-23 DIAGNOSIS — F419 Anxiety disorder, unspecified: ICD-10-CM

## 2017-08-23 DIAGNOSIS — M7030 Other bursitis of elbow, unspecified elbow: ICD-10-CM

## 2017-08-23 DIAGNOSIS — E039 Hypothyroidism, unspecified: Principal | ICD-10-CM

## 2017-08-23 DIAGNOSIS — I10 Essential (primary) hypertension: ICD-10-CM

## 2017-08-23 DIAGNOSIS — M1990 Unspecified osteoarthritis, unspecified site: ICD-10-CM

## 2017-08-23 DIAGNOSIS — R739 Hyperglycemia, unspecified: ICD-10-CM

## 2017-08-23 DIAGNOSIS — Z9221 Personal history of antineoplastic chemotherapy: ICD-10-CM

## 2017-08-23 DIAGNOSIS — E042 Nontoxic multinodular goiter: ICD-10-CM

## 2017-08-23 DIAGNOSIS — F329 Major depressive disorder, single episode, unspecified: ICD-10-CM

## 2017-08-23 DIAGNOSIS — C171 Malignant neoplasm of jejunum: ICD-10-CM

## 2017-08-23 DIAGNOSIS — C49A3 Gastrointestinal stromal tumor of small intestine: ICD-10-CM

## 2017-08-23 MED ORDER — LEVOTHYROXINE 50 MCG PO TAB
50 ug | ORAL_TABLET | Freq: Every day | ORAL | 0 refills
Start: 2017-08-23 — End: ?

## 2017-08-23 MED ORDER — TRAMADOL 50 MG PO TAB
ORAL_TABLET | Freq: Four times a day (QID) | 0 refills | Status: AC | PRN
Start: 2017-08-23 — End: 2017-09-29

## 2017-08-23 MED ORDER — LEVOTHYROXINE 112 MCG PO TAB
112 ug | ORAL_TABLET | Freq: Every day | ORAL | 11 refills | 30.00000 days | Status: AC
Start: 2017-08-23 — End: ?

## 2017-08-23 NOTE — Progress Notes
Date of Service: 08/23/2017    Subjective:             Willie Sandoval is a 71 y.o. male.    History of Present Illness  Willie Sandoval is a 71 year old male who presents for initial endocrine evaluation of hypothyroidism.  He was referred by his oncologist, Dr. Benjiman Core.  I have reviewed notes from Dr. Benjiman Core as well as labs obtained over the 2 years.  He presents today with his wife.  He has a history of GIST tumor diagnosed in 2008, previously treated with the back but more recently with ipilimumab and nivolumab.    He was initially seen by me in clinic several months ago and at that time he started levothyroxine 50 mcg per day.  Since that time he has had very strange thyroid labs.  I then asked him to stop taking the biotin prior to having labs drawn in the labs obtained prior to her visit today are more normal.  TSH remains elevated at 10, though T4 is appropriate.  These labs are shown below.  TSH is 10.3, free T4 0.8.    He has been taking levothyroxine 100 mcg each morning for the last month.  He takes it on an empty stomach and waits 30 minutes before taking other medications or food.  He denies palpitations, difficulty sleeping, excessive sleeping, weight change, change in skin or nails.  He has been taking biotin because he has been losing hair.  He admits to having some change in voice, though this is been going on for a few years,  and he thinks his voice is actually improved recently.  He denies fullness in his neck.  He has known thyroid nodules and goiter and is following with Dr. Cherie Dark for this.         Review of Systems    A comprehensive review of systems was obtained and is negative except for the following: Heat sensitivity, ringing in the ears, decrease in hearing,dry mouth, wheezing, heartburn, lack of sex drive, numbness tingling, memory loss, joint pain and stiffness, back pain, muscle cramps.    Past Medical History:   Diagnosis Date   ??? Anxiety disorder    ??? Arthritis    ??? Bursitis of elbow ??? Depression    ??? Hyperlipidemia    ??? Hypertension    ??? 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 Personal history of antineoplastic chemotherapy 2009    received Gleevec 12/08 to 12/09     Past Surgical History:   Procedure Laterality Date   ??? 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 SONO GUIDANCE/NEEDLE BIOPSY  05/11/12    abdominal mass core biopsy, Gastrointestinal Stromal tumor   ??? CT HISTORICAL REPORT  03/15/13    CTabdomin- continued slight decrease in size of the dominant mesenteric mass and small mesenteric nodules compatible with favorable response to tx, stable exophytic L renal lesion   ??? UPPER GASTROINTESTINAL ENDOSCOPY N/A 03/20/2015    ESOPHAGOGASTRODUODENOSCOPY performed by Raynelle Chary, MD at ENDO[facility]GI   ??? ABDOMINAL EXPLORATION SURGERY N/A 06/20/2015    LAPAROTOMY EXPLORATORY, LYSIS OF ADHESIONS, POSSIBLE SMALL BOWEL RESECTION, POSSIBLE OSTOMY, SCAR REVISION performed by Genia Del, MD at [facility]   ??? HX APPENDECTOMY     ??? HX CHOLECYSTECTOMY     ???  HX MASTECTOMY      history of gynecomastia   ??? HX SURGERY      excision of intestinal tumor   ??? LUMBAR SPINE SURGERY      aspen device   ??? MASTECTOMY Bilateral     6th grade   ??? TONSIL AND ADENOIDECTOMY     ??? VASECTOMY       Family History   Problem Relation Age of Onset   ??? Cancer-Breast Mother    ??? Cancer-Breast Maternal Grandmother    ??? Cancer-Colon Maternal Grandmother    ??? Cancer-Prostate Father    ??? Alcohol abuse Maternal Uncle    ??? Alcohol abuse Maternal Uncle      Social History     Social History   ??? Marital status: Married     Spouse name: N/A   ??? Number of children: N/A   ??? Years of education: N/A     Social History Main Topics   ??? Smoking status: Former Smoker     Packs/day: 1.00     Years: 45.00     Types: Cigarettes     Start date: 08/23/1957     Quit date: 08/23/2006   ??? Smokeless tobacco: Never Used ??? Alcohol use 7.0 oz/week     14 Standard drinks or equivalent per week   ??? Drug use: No   ??? Sexual activity: Yes     Partners: Female     Other Topics Concern   ??? Not on file     Social History Narrative   ??? No narrative on file         Objective:         ??? acetaminophen (TYLENOL) 500 mg tablet Take 500 mg by mouth every 6 hours as needed for Pain. Max of 4,000 mg of acetaminophen in 24 hours.   ??? ALPRAZolam (XANAX) 0.5 mg tablet Take 0.5-1 tablets by mouth daily as needed for Anxiety.   ??? aspirin EC 81 mg tablet Take 81 mg by mouth daily.   ??? BIFIDOBACTERIUM INFANTIS (ALIGN PO) Take 1 capsule by mouth. 5 billion cell capsule   ??? CELECOXIB (CELEBREX PO) Take 1 tablet by mouth daily.   ??? cyanocobalamin(+) (VITAMIN B-12) 500 mcg tablet Take 500 mcg by mouth daily.   ??? cyclobenzaprine (FLEXERIL) 10 mg tablet Take one tablet by mouth twice daily as needed for Muscle Cramps.   ??? ferrous sulfate (FEOSOL, FEROSUL) 325 mg (65 mg iron) tablet Take 325 mg by mouth daily. Take on an empty stomach at least 1 hour before or 2 hours after food.   ??? fish oil /omega-3 fatty acids (SEA-OMEGA) 340/1000 mg capsule Take 1 Cap by mouth daily.   ??? furosemide (LASIX) 40 mg tablet Take 40 mg by mouth daily.   ??? gabapentin (NEURONTIN) 400 mg capsule TAKE 2 CAPSULES BY MOUTH TWICE A DAY   ??? Garlic 1,000 mg cap Take 1 Cap by mouth daily.   ??? GLUCOSAMINE SULF/CHONDROITIN A (GLUCOSAMINE SULF-CHONDROITINSA PO) Take 1 tablet by mouth twice daily.   ??? levothyroxine (SYNTHROID) 100 mcg tablet Take one tablet by mouth daily 30 minutes before breakfast.   ??? lidocaine viscous-diphenhydrAMINE-alum/mag hydroxide/simeth 1:1:1 suspension Swish and Spit 10 mL by mouth as directed before meals and at bedtime.   ??? lidocaine/prilocaine (EMLA) 2.5/2.5 % topical cream Apply to port area 30 minutes prior to access   ??? lisinopril (PRINIVIL; ZESTRIL) 5 mg tablet Take 5 mg by mouth daily.   ??? magnesium oxide (MAG-OX)  400 mg tablet Take 400 mg by mouth daily. ??? metoprolol (LOPRESSOR) 25 mg tablet Take 25 mg by mouth twice daily.   ??? MV with Min-Lycopene-Lutein (CENTRUM SILVER) 0.4-300-250 mg-mcg-mcg tab Take 1 Tab by mouth daily.   ??? ondansetron (ZOFRAN) 8 mg tablet Take 1 tablet by mouth every 8 hours as needed for Nausea.   ??? oxymetazoline (AFRIN) 0.05 % nasal spray Apply 1 spray to each nostril as directed twice daily as needed.   ??? potassium chloride SR (K-DUR) 20 mEq tablet Take 20 mEq by mouth daily. Take with a meal and a full glass of water.   ??? ranitidine(+) (ZANTAC) 300 mg tablet Take 300 mg by mouth daily.   ??? regorafenib (STIVARGA) 40 mg tablet Take 2 tabs by mouth daily on Days 1-21 of each 28-day cycle. Take at the same time each day with a low fat meal.   ??? sertraline (ZOLOFT) 100 mg tablet Take 2 tablets by mouth daily. 90 day supply   ??? simethicone (MYLICON) 80 mg chew tablet Take 1 Tab by mouth every 6 hours as needed. (Patient taking differently: Chew 80-160 mg by mouth daily as needed.)   ??? sucralfate (CARAFATE) 1 gram tablet Take 1 g by mouth daily as needed (as direc). Take on an empty stomach.   ??? tamsulosin (FLOMAX) 0.4 mg capsule Take 1 Cap by mouth daily after breakfast.   ??? traMADol (ULTRAM) 50 mg tablet 1-2 tablets every 6 hours as needed for pain   ??? traZODone (DESYREL) 100 mg tablet Take 50 mg qhs.  May repeat an additional 50 mg qhs prn if not asleep in 1 hour.  90 day supply   ??? triamcinolone (NASACORT) 55 mcg nasal inhaler Apply 2 Sprays to each nostril as directed at bedtime as needed.   ??? trimethoprim/sulfamethoxazole (BACTRIM DS) 160/800 mg tablet Take one tablet by mouth twice daily.   ??? vitamin E 400 unit capsule Take 400 Units by mouth daily.     Vitals:    08/23/17 1406   BP: 124/73   Pulse: 70   Weight: 121.1 kg (267 lb)   Height: 188 cm (74)     Body mass index is 34.28 kg/m???.     Physical Exam    Nursing note and vitals reviewed.  Constitutional: He is oriented to person, place, and time. He appears well-nourished. No distress.   HENT:   Head: Normocephalic and atraumatic. Cyst under left side of chin.  Mouth/Throat: Oropharynx is clear and moist.   Eyes: Conjunctivae normal and EOM are normal. Pupils are equal, round, and reactive to light.   Neck: Neck supple. Thyromegaly on right > left.   Cardiovascular: Normal rate, regular rhythm and normal heart sounds.    Pulmonary/Chest: Effort normal and breath sounds normal. No respiratory distress.   Abdominal: Soft. Bowel sounds are normal. He exhibits no distension.   Musculoskeletal: He exhibits no edema and no tenderness.   Lymphadenopathy:     He has no cervical adenopathy.   Neurological: He is alert and oriented to person, place, and time.      Skin: Skin is warm and dry. No erythema.   Psychiatric: He has a normal mood and affect. His behavior is normal. Thought content normal.            Ref. Range 08/18/2017 13:19   T4-Free Latest Ref Range: 0.6 - 1.6 NG/DL 0.8   TSH Latest Ref Range: 0.35 - 5.00 MCU/ML 10.320 (H)  Assessment and Plan:  Willie Sandoval was seen today for hypothyroidism.    Diagnoses and all orders for this visit:    Acquired hypothyroidism, uncontrolled  Multiple thyroid nodules  Goiter  Hypothyroidism diagnosed in April 2018 after brief period of hyperthyroidism in January 2018.  Unclear if this is related to his oncologic regimen of ipilimumab and nivolumab, as he has known structural abnormalities with thyroid nodules in both right and left lobes.  He is following with Dr. Cherie Dark for thyroid nodules and goiter.  Thyroid labs shown above while taking levothyroxine 100 mcg per day.  TSH is elevated.  Begin treatment with levothyroxine 112 mcg per day.  I have sent a prescription for this to his pharmacy.  Check TSH with free T4 reflex in 6-8 weeks.    Elevated serum glucose (new problem)  He has had several readings with elevated glucose, though these have been postprandial or unknown timing. Hemoglobin A1c to be checked with next lab draw.    GIST (gastrointestinal stromal tumor) of small bowel, malignant  Diagnosed initially in 2008.  Surgery with Dr. Cherie Dark.  He is following with Dr. Benjiman Core for this.  Current treatment is ipilimumab and nivolumab    Return to clinic in 3-6 months.

## 2017-08-26 ENCOUNTER — Encounter: Admit: 2017-08-26 | Discharge: 2017-08-26 | Payer: MEDICARE

## 2017-08-26 ENCOUNTER — Ambulatory Visit: Admit: 2017-08-26 | Discharge: 2017-08-27 | Payer: MEDICARE

## 2017-08-26 DIAGNOSIS — F329 Major depressive disorder, single episode, unspecified: ICD-10-CM

## 2017-08-26 DIAGNOSIS — Z9221 Personal history of antineoplastic chemotherapy: ICD-10-CM

## 2017-08-26 DIAGNOSIS — E785 Hyperlipidemia, unspecified: ICD-10-CM

## 2017-08-26 DIAGNOSIS — M7030 Other bursitis of elbow, unspecified elbow: ICD-10-CM

## 2017-08-26 DIAGNOSIS — M1990 Unspecified osteoarthritis, unspecified site: ICD-10-CM

## 2017-08-26 DIAGNOSIS — I10 Essential (primary) hypertension: ICD-10-CM

## 2017-08-26 DIAGNOSIS — F419 Anxiety disorder, unspecified: ICD-10-CM

## 2017-08-26 DIAGNOSIS — C171 Malignant neoplasm of jejunum: Principal | ICD-10-CM

## 2017-08-26 NOTE — Progress Notes
[facility] CLINIC NOTE  Subjective     SUBJECTIVE: Mr. Fendley presents in follow-up for ongoing care regarding back and lower extremity pain.  He is undergone bilateral L5-S1 transforaminal epidural steroid injections in the past with good benefit.  His main complaint today is left-sided lumbar pain which radiates in the posterior and lateral aspect of the left lower extremity to the foot.  He wishes to have another procedure scheduled.         Review of Systems   Constitutional: Positive for diaphoresis and fatigue.   HENT: Positive for congestion, hearing loss, rhinorrhea and tinnitus.    Eyes: Negative.    Respiratory: Positive for apnea.    Cardiovascular: Negative.    Gastrointestinal: Positive for abdominal distention.   Endocrine: Positive for heat intolerance and polydipsia.   Genitourinary: Negative.    Musculoskeletal: Positive for arthralgias, back pain and gait problem.   Skin: Negative.    Allergic/Immunologic: Negative.    Hematological: Negative.    Psychiatric/Behavioral: Positive for agitation, decreased concentration and sleep disturbance.   All other systems reviewed and are negative.      Current Outpatient Prescriptions:   ???  acetaminophen (TYLENOL) 500 mg tablet, Take 500 mg by mouth every 6 hours as needed for Pain. Max of 4,000 mg of acetaminophen in 24 hours., Disp: , Rfl:   ???  ALPRAZolam (XANAX) 0.5 mg tablet, Take 0.5-1 tablets by mouth daily as needed for Anxiety., Disp: 30 tablet, Rfl: 1  ???  aspirin EC 81 mg tablet, Take 81 mg by mouth daily., Disp: , Rfl:   ???  CELECOXIB (CELEBREX PO), Take 1 tablet by mouth daily., Disp: , Rfl:   ???  cyanocobalamin(+) (VITAMIN B-12) 500 mcg tablet, Take 500 mcg by mouth daily., Disp: , Rfl:   ???  cyclobenzaprine (FLEXERIL) 10 mg tablet, Take one tablet by mouth twice daily as needed for Muscle Cramps., Disp: 60 tablet, Rfl: 3  ???  fish oil /omega-3 fatty acids (SEA-OMEGA) 340/1000 mg capsule, Take 1 Cap by mouth daily., Disp: , Rfl: ???  furosemide (LASIX) 40 mg tablet, Take 40 mg by mouth daily., Disp: , Rfl:   ???  gabapentin (NEURONTIN) 400 mg capsule, TAKE 2 CAPSULES BY MOUTH TWICE A DAY, Disp: 360 capsule, Rfl: 1  ???  Garlic 1,000 mg cap, Take 1 Cap by mouth daily., Disp: , Rfl:   ???  GLUCOSAMINE SULF/CHONDROITIN A (GLUCOSAMINE SULF-CHONDROITINSA PO), Take 1 tablet by mouth twice daily., Disp: , Rfl:   ???  levothyroxine (SYNTHROID) 112 mcg tablet, Take one tablet by mouth daily 30 minutes before breakfast., Disp: 30 tablet, Rfl: 11  ???  lidocaine viscous-diphenhydrAMINE-alum/mag hydroxide/simeth 1:1:1 suspension, Swish and Spit 10 mL by mouth as directed before meals and at bedtime., Disp: 300 mL, Rfl: 0  ???  lidocaine/prilocaine (EMLA) 2.5/2.5 % topical cream, Apply to port area 30 minutes prior to access, Disp: 30 g, Rfl: 2  ???  lisinopril (PRINIVIL; ZESTRIL) 5 mg tablet, Take 5 mg by mouth daily., Disp: , Rfl:   ???  magnesium oxide (MAG-OX) 400 mg tablet, Take 400 mg by mouth daily., Disp: , Rfl:   ???  metoprolol (LOPRESSOR) 25 mg tablet, Take 25 mg by mouth twice daily., Disp: , Rfl:   ???  MV with Min-Lycopene-Lutein (CENTRUM SILVER) 0.4-300-250 mg-mcg-mcg tab, Take 1 Tab by mouth daily., Disp: , Rfl:   ???  ondansetron (ZOFRAN) 8 mg tablet, Take 1 tablet by mouth every 8 hours as needed for Nausea., Disp:  20 tablet, Rfl: 2  ???  oxymetazoline (AFRIN) 0.05 % nasal spray, Apply 1 spray to each nostril as directed twice daily as needed., Disp: , Rfl:   ???  potassium chloride SR (K-DUR) 20 mEq tablet, Take 20 mEq by mouth daily. Take with a meal and a full glass of water., Disp: , Rfl:   ???  ranitidine(+) (ZANTAC) 300 mg tablet, Take 300 mg by mouth daily., Disp: , Rfl:   ???  regorafenib (STIVARGA) 40 mg tablet, Take 2 tabs by mouth daily on Days 1-21 of each 28-day cycle. Take at the same time each day with a low fat meal., Disp: 56 tablet, Rfl: 0  ???  sertraline (ZOLOFT) 100 mg tablet, Take 2 tablets by mouth daily. 90 day supply, Disp: 180 tablet, Rfl: 1 ???  simethicone (MYLICON) 80 mg chew tablet, Take 1 Tab by mouth every 6 hours as needed. (Patient taking differently: Chew 80-160 mg by mouth daily as needed.), Disp: 30 Tab, Rfl: 0  ???  sucralfate (CARAFATE) 1 gram tablet, Take 1 g by mouth daily as needed (as direc). Take on an empty stomach., Disp: , Rfl:   ???  tamsulosin (FLOMAX) 0.4 mg capsule, Take 1 Cap by mouth daily after breakfast., Disp: 90 Cap, Rfl: 3  ???  traMADol (ULTRAM) 50 mg tablet, 1-2 tablets every 6 hours as needed for pain, Disp: 60 tablet, Rfl: 0  ???  traZODone (DESYREL) 100 mg tablet, Take 50 mg qhs.  May repeat an additional 50 mg qhs prn if not asleep in 1 hour.  90 day supply, Disp: 90 tablet, Rfl: 1  ???  triamcinolone (NASACORT) 55 mcg nasal inhaler, Apply 2 Sprays to each nostril as directed at bedtime as needed., Disp: , Rfl:   ???  trimethoprim/sulfamethoxazole (BACTRIM DS) 160/800 mg tablet, Take one tablet by mouth twice daily., Disp: 10 tablet, Rfl: 0  ???  vitamin E 400 unit capsule, Take 400 Units by mouth daily., Disp: , Rfl:   Allergies   Allergen Reactions   ??? Levaquin [Levofloxacin] ANAPHYLAXIS   ??? Lanolin RASH   ??? 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 Herbie Saxon     Physical Exam  Vitals:    08/26/17 1033   BP: 121/72   Weight: 122.5 kg (270 lb)   Height: 188 cm (74)        Pain Score: Six  Body mass index is 34.67 kg/m???.  General: Alert and oriented, very pleasant male.   HEENT showed extraocular muscles were intact and no other abnormalities.  Unlabored breathing.  Regular rate and rhythm on CV exam.   5/5 strength in bilateral upper and lower extremities.    Sensation is intact to light touch and equal in the upper and lower extremities.  Left lumbar tenderness to palpation       IMPRESSION:  1. Lumbar disc disease with radiculopathy    2. Lumbar radiculopathy          PLAN: We will schedule a left L4-5 transforaminal cortisone injection producing back and leg pain.  i am also providing a prescription for a motorized scooter.

## 2017-08-27 DIAGNOSIS — M5416 Radiculopathy, lumbar region: ICD-10-CM

## 2017-08-27 DIAGNOSIS — M5116 Intervertebral disc disorders with radiculopathy, lumbar region: Principal | ICD-10-CM

## 2017-09-01 ENCOUNTER — Encounter: Admit: 2017-09-01 | Discharge: 2017-09-01 | Payer: MEDICARE

## 2017-09-01 ENCOUNTER — Encounter: Admit: 2017-09-01 | Discharge: 2017-09-02 | Payer: MEDICARE

## 2017-09-01 DIAGNOSIS — M7030 Other bursitis of elbow, unspecified elbow: ICD-10-CM

## 2017-09-01 DIAGNOSIS — F1010 Alcohol abuse, uncomplicated: ICD-10-CM

## 2017-09-01 DIAGNOSIS — E042 Nontoxic multinodular goiter: ICD-10-CM

## 2017-09-01 DIAGNOSIS — C49A3 Gastrointestinal stromal tumor of small intestine: Principal | ICD-10-CM

## 2017-09-01 DIAGNOSIS — C787 Secondary malignant neoplasm of liver and intrahepatic bile duct: ICD-10-CM

## 2017-09-01 DIAGNOSIS — Z9221 Personal history of antineoplastic chemotherapy: ICD-10-CM

## 2017-09-01 DIAGNOSIS — R739 Hyperglycemia, unspecified: ICD-10-CM

## 2017-09-01 DIAGNOSIS — I10 Essential (primary) hypertension: ICD-10-CM

## 2017-09-01 DIAGNOSIS — F4323 Adjustment disorder with mixed anxiety and depressed mood: ICD-10-CM

## 2017-09-01 DIAGNOSIS — R8290 Unspecified abnormal findings in urine: ICD-10-CM

## 2017-09-01 DIAGNOSIS — E785 Hyperlipidemia, unspecified: ICD-10-CM

## 2017-09-01 DIAGNOSIS — M1990 Unspecified osteoarthritis, unspecified site: ICD-10-CM

## 2017-09-01 DIAGNOSIS — C171 Malignant neoplasm of jejunum: Principal | ICD-10-CM

## 2017-09-01 DIAGNOSIS — F419 Anxiety disorder, unspecified: ICD-10-CM

## 2017-09-01 DIAGNOSIS — F329 Major depressive disorder, single episode, unspecified: ICD-10-CM

## 2017-09-01 LAB — CBC AND DIFF
Lab: 0.1 10*3/uL (ref 0–0.20)
Lab: 11 10*3/uL — ABNORMAL HIGH (ref 4.5–11.0)
Lab: 4.6 M/UL (ref 4.4–5.5)

## 2017-09-01 MED ORDER — HEPARIN, PORCINE (PF) 100 UNIT/ML IV SYRG
500 [IU] | Freq: Once | INTRAVENOUS | 0 refills | Status: CP
Start: 2017-09-01 — End: ?
  Administered 2017-09-01: 19:00:00 500 [IU] via INTRAVENOUS

## 2017-09-01 NOTE — Progress Notes
Pt presents today for port draw prior to follow up. Port accessed with positive blood return noted, labs drawn. Port flushed with saline and packed with heparin prior to deaccess. Pt released in stable condition.
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
## 2017-09-01 NOTE — Research Notes
Clinical research note for [id number]   [id number] DART     SID # [id number]    Re-Consent Note  Consent version date:08/30/17 to 08/17/18    Patient in the clinic today for a follow up visit with Dr. Edwin Dada.    Most recent IRB approved  informed consent addendum was discussed with the subject and his wife during this visit. Subject was alert and oriented during consent discussion.  New information regarding risks were reviewed and discussed in detail.  Patient verbalized understanding.       Questions asked were answered to subject's satisfaction and subject voiced desire to sign consent today.  He signed consent without coercion and undue influence. A copy of the signed consent was given to the subject and scanned to subject's medical record.    Advised patient to call with any research related questions.  Patient verbalized understanding.      VF

## 2017-09-02 LAB — COMPREHENSIVE METABOLIC PANEL
Lab: 0.7 mg/dL (ref 0.3–1.2)
Lab: 1.1 mg/dL (ref 0.4–1.24)
Lab: 101 MMOL/L (ref 98–110)
Lab: 139 MMOL/L (ref 137–147)
Lab: 147 U/L — ABNORMAL HIGH (ref 25–110)
Lab: 153 mg/dL — ABNORMAL HIGH (ref 70–100)
Lab: 19 mg/dL (ref 7–25)
Lab: 27 U/L — ABNORMAL HIGH (ref 7–56)
Lab: 3.8 g/dL — ABNORMAL LOW (ref 3.5–5.0)
Lab: 30 MMOL/L (ref 21–30)
Lab: 32 U/L (ref 7–40)
Lab: 4.2 MMOL/L (ref 3.5–5.1)
Lab: 7.2 g/dL (ref 6.0–8.0)
Lab: 9.4 mg/dL (ref 8.5–10.6)
Lab: >60 mL/min (ref >60)
Lab: >60 mL/min (ref >60)

## 2017-09-02 LAB — CULTURE-URINE W/SENSITIVITY

## 2017-09-02 LAB — HEMOGLOBIN A1C: Lab: 7 % — ABNORMAL HIGH (ref 4.0–6.0)
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
## 2017-09-12 ENCOUNTER — Encounter: Admit: 2017-09-12 | Discharge: 2017-09-12 | Payer: MEDICARE

## 2017-09-12 ENCOUNTER — Ambulatory Visit: Admit: 2017-09-12 | Discharge: 2017-09-13 | Payer: MEDICARE

## 2017-09-12 DIAGNOSIS — F329 Major depressive disorder, single episode, unspecified: ICD-10-CM

## 2017-09-12 DIAGNOSIS — I10 Essential (primary) hypertension: ICD-10-CM

## 2017-09-12 DIAGNOSIS — F419 Anxiety disorder, unspecified: ICD-10-CM

## 2017-09-12 DIAGNOSIS — M1990 Unspecified osteoarthritis, unspecified site: ICD-10-CM

## 2017-09-12 DIAGNOSIS — Z9221 Personal history of antineoplastic chemotherapy: ICD-10-CM

## 2017-09-12 DIAGNOSIS — M7030 Other bursitis of elbow, unspecified elbow: ICD-10-CM

## 2017-09-12 DIAGNOSIS — M5416 Radiculopathy, lumbar region: Principal | ICD-10-CM

## 2017-09-12 DIAGNOSIS — E785 Hyperlipidemia, unspecified: ICD-10-CM

## 2017-09-12 DIAGNOSIS — C171 Malignant neoplasm of jejunum: ICD-10-CM

## 2017-09-12 LAB — POC GLUCOSE: Lab: 123 mg/dL — ABNORMAL HIGH (ref 70–100)

## 2017-09-12 MED ORDER — IOPAMIDOL 41 % IT SOLN
2.5 mL | Freq: Once | EPIDURAL | 0 refills | Status: DC
Start: 2017-09-12 — End: 2017-09-12

## 2017-09-12 MED ORDER — TRIAMCINOLONE ACETONIDE 40 MG/ML IJ SUSP
80 mg | Freq: Once | EPIDURAL | 0 refills | Status: CP
Start: 2017-09-12 — End: ?
  Administered 2017-09-12: 17:00:00 80 mg via EPIDURAL

## 2017-09-12 MED ORDER — DIAZEPAM 5 MG PO TAB
10 mg | ORAL | 0 refills | Status: DC | PRN
Start: 2017-09-12 — End: 2017-09-12
  Administered 2017-09-12: 16:00:00 10 mg via ORAL

## 2017-09-12 MED ORDER — IOPAMIDOL 41 % IT SOLN
2.5 mL | Freq: Once | EPIDURAL | 0 refills | Status: CP
Start: 2017-09-12 — End: ?
  Administered 2017-09-12: 17:00:00 2.5 mL via EPIDURAL

## 2017-09-12 MED ORDER — TRIAMCINOLONE ACETONIDE 40 MG/ML IJ SUSP
80 mg | Freq: Once | EPIDURAL | 0 refills | Status: DC
Start: 2017-09-12 — End: 2017-09-12

## 2017-09-12 NOTE — Procedures
Attending Surgeon: Jacquelin Krajewski B Jaivian Battaglini, MD    Anesthesia: Local    Pre-Procedure Diagnosis:   1. Lumbar disc disease with radiculopathy    2. Lumbar radiculopathy        Post-Procedure Diagnosis:   1. Lumbar disc disease with radiculopathy    2. Lumbar radiculopathy        SNR/TF LMBR/SAC  Procedure: transforaminal epidural    Laterality: bilateral  Location: lumbar - L4-5      Consent:   Consent obtained: written  Consent given by: patient  Risks discussed: allergic reaction, bleeding, infection, weakness and no change or worsening in pain  Alternatives discussed: alternative treatment     Universal Protocol:  Relevant documents: relevant documents present and verified  Test results: test results available and properly labeled  Imaging studies: imaging studies available  Required items: required blood products, implants, devices, and special equipment available  Site marked: the operative site was marked  Patient identity confirmed: Patient identify confirmed verbally with patient.      Time out: Immediately prior to procedure a "time out" was called to verify the correct patient, procedure, equipment, support staff and site/side marked as required      Procedures Details:   Indications: pain   Prep: chlorhexidine  Patient position: prone  Estimated Blood Loss: minimal  Specimens: none  Number of Levels: 1  Guidance: fluoroscopy  Needle size: 25 G  Injection procedure: Negative aspiration for blood  Patient tolerance: Patient tolerated the procedure well with no immediate complications. Pressure was applied, and hemostasis was accomplished.  Outcome: Pain improved  Comments: Kenalog 40 mg was injected at each location      Estimated blood loss: none or minimal  Specimens: none  Patient tolerated the procedure well with no immediate complications. Pressure was applied, and hemostasis was accomplished.

## 2017-09-13 ENCOUNTER — Ambulatory Visit: Admit: 2017-09-12 | Discharge: 2017-09-13 | Payer: MEDICARE

## 2017-09-13 ENCOUNTER — Encounter: Admit: 2017-09-13 | Discharge: 2017-09-13 | Payer: MEDICARE

## 2017-09-13 DIAGNOSIS — Z85068 Personal history of other malignant neoplasm of small intestine: ICD-10-CM

## 2017-09-13 DIAGNOSIS — I10 Essential (primary) hypertension: ICD-10-CM

## 2017-09-13 DIAGNOSIS — M1990 Unspecified osteoarthritis, unspecified site: ICD-10-CM

## 2017-09-13 DIAGNOSIS — E785 Hyperlipidemia, unspecified: ICD-10-CM

## 2017-09-13 DIAGNOSIS — Z87891 Personal history of nicotine dependence: ICD-10-CM

## 2017-09-13 DIAGNOSIS — M5116 Intervertebral disc disorders with radiculopathy, lumbar region: Principal | ICD-10-CM

## 2017-09-13 DIAGNOSIS — Z9221 Personal history of antineoplastic chemotherapy: ICD-10-CM

## 2017-09-13 DIAGNOSIS — C787 Secondary malignant neoplasm of liver and intrahepatic bile duct: ICD-10-CM

## 2017-09-13 DIAGNOSIS — C49A3 Gastrointestinal stromal tumor of small intestine: Principal | ICD-10-CM

## 2017-09-13 LAB — CBC AND DIFF
Lab: 0.1 10*3/uL (ref 0–0.20)
Lab: 15 10*3/uL — ABNORMAL HIGH (ref 4.5–11.0)
Lab: 5.1 M/UL (ref 4.4–5.5)

## 2017-09-13 MED ORDER — SODIUM CHLORIDE 0.9 % IJ SOLN
50 mL | Freq: Once | INTRAVENOUS | 0 refills | Status: CP
Start: 2017-09-13 — End: ?
  Administered 2017-09-13: 20:00:00 50 mL via INTRAVENOUS

## 2017-09-13 MED ORDER — IOHEXOL 300 MG IODINE/ML IV SOLN
123 mL | Freq: Once | INTRAVENOUS | 0 refills | Status: CP
Start: 2017-09-13 — End: ?
  Administered 2017-09-13: 20:00:00 123 mL via INTRAVENOUS

## 2017-09-13 MED ORDER — HEPARIN, PORCINE (PF) 100 UNIT/ML IV SYRG
500 [IU] | Freq: Once | INTRAVENOUS | 0 refills | Status: CP
Start: 2017-09-13 — End: ?
  Administered 2017-09-13: 20:00:00 500 [IU] via INTRAVENOUS

## 2017-09-13 NOTE — Progress Notes
Labs drawn from port and port accessed for scan.    Port flushed per protocol following scan.    Discharged in good condition, ambulating with cane.

## 2017-09-14 LAB — COMPREHENSIVE METABOLIC PANEL
Lab: 0.8 mg/dL (ref 0.3–1.2)
Lab: 1.1 mg/dL (ref 0.4–1.24)
Lab: 135 MMOL/L — ABNORMAL LOW (ref 137–147)
Lab: 137 U/L — ABNORMAL HIGH (ref 25–110)
Lab: 154 mg/dL — ABNORMAL HIGH (ref 70–100)
Lab: 19 mg/dL (ref 7–25)
Lab: 27 MMOL/L (ref 21–30)
Lab: 27 U/L — ABNORMAL HIGH (ref 7–56)
Lab: 36 U/L (ref 7–40)
Lab: 4.1 g/dL — ABNORMAL LOW (ref 3.5–5.0)
Lab: 4.2 MMOL/L (ref 3.5–5.1)
Lab: 7.7 g/dL (ref 6.0–8.0)
Lab: 9 10*3/uL (ref 3–12)
Lab: 9.4 mg/dL (ref 8.5–10.6)
Lab: 99 MMOL/L (ref 98–110)
Lab: >60 mL/min (ref >60)
Lab: >60 mL/min — ABNORMAL HIGH (ref >60)

## 2017-09-16 ENCOUNTER — Encounter: Admit: 2017-09-16 | Discharge: 2017-09-16 | Payer: MEDICARE

## 2017-09-16 DIAGNOSIS — C171 Malignant neoplasm of jejunum: ICD-10-CM

## 2017-09-16 DIAGNOSIS — E785 Hyperlipidemia, unspecified: ICD-10-CM

## 2017-09-16 DIAGNOSIS — C49A3 Gastrointestinal stromal tumor of small intestine: Principal | ICD-10-CM

## 2017-09-16 DIAGNOSIS — F329 Major depressive disorder, single episode, unspecified: ICD-10-CM

## 2017-09-16 DIAGNOSIS — I10 Essential (primary) hypertension: ICD-10-CM

## 2017-09-16 DIAGNOSIS — Z9221 Personal history of antineoplastic chemotherapy: ICD-10-CM

## 2017-09-16 DIAGNOSIS — M7030 Other bursitis of elbow, unspecified elbow: ICD-10-CM

## 2017-09-16 DIAGNOSIS — E119 Type 2 diabetes mellitus without complications: ICD-10-CM

## 2017-09-16 DIAGNOSIS — M1990 Unspecified osteoarthritis, unspecified site: ICD-10-CM

## 2017-09-16 DIAGNOSIS — F419 Anxiety disorder, unspecified: ICD-10-CM
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
## 2017-09-19 ENCOUNTER — Encounter: Admit: 2017-09-19 | Discharge: 2017-09-19 | Payer: MEDICARE

## 2017-09-19 DIAGNOSIS — C49A3 Gastrointestinal stromal tumor of small intestine: Principal | ICD-10-CM

## 2017-09-19 MED ORDER — REGORAFENIB 40 MG PO TAB
ORAL_TABLET | Freq: Every day | ORAL | 0 refills | Status: AC
Start: 2017-09-19 — End: 2018-01-10

## 2017-09-19 MED ORDER — REGORAFENIB 40 MG PO TAB
ORAL_TABLET | Freq: Every day | ORAL | 0 refills | Status: AC
Start: 2017-09-19 — End: 2017-09-19

## 2017-09-19 NOTE — Telephone Encounter
Received call from pt's wife, [name], she is asking what the plan is from Dr. Kalman Shan for pt's elevated Hgb A1c. Discussed with [name], Dr. Alene Mires recommendation from lab results on 09/02/17.      See copied message from lab result:  Viewed by Mitzi Davenport on 09/03/2017 9:14 PM   Written by Arville Lime, MD on 09/02/2017 3:36 PM   Legrand Como,   Your lab shows that you do have diabetes. You can either make an appointment with one of our nurse practitioners in the next month or so or be seen by your primary care doctor. Even if you chose to follow with your primary care doctor, we have diabetes educators and dietitians who can see you for education even if you get your diabetes care elsewhere. If you are seen in our clinic, we would want to see you every 3 months (either at [facility] or at the clinic next to the hospital).   Arville Lime, MD     [name] would like our clinic to follow [place] for his diabetes care moving forward.  I let her know, I would pass this information on to Frederik Schmidt with [facility] and he would assist her with scheduling [facility] for all visits moving forward.     Forwarding to [place] for further follow up, thank you  Closing this encounter

## 2017-09-19 NOTE — Oral Chemotherapy Note
Name:Willie Sandoval           MRN: [id number]                 DOB:13-Dec-1946          Age: 71 y.o.  Date of Service: 09/19/2017      Oral Chemotherapy Refill Review     Patient Information: Willie Sandoval    Correct patient/DOB/Diagnosis: Yes    Reviewed most recent clinic notes for changes in plan: Yes    Most recent labs reviewed: Yes    Appointment Information:   Date of last appointment:  09/16/2017    Follow up appointment scheduled : Yes 10/04/2017      Medication Information:     Medication name: Regorafenib    Medication in [place]: Yes     Date of last refill released from [facility]: 08/11/2017    Weight change >10%: No    Has a dose adjustment been made since last refill?Yes    Is Pharmacy listed in [place] treatment plan? Yes    Pharmacy: [facility] patient assistance program     Consent Date 05/17/2017    Patient Contact  Method of communication: in-office on 09/16/2017  [name] confirmed need for refill: Yes  Patient taking medication as directed Yes  Confirmation of receiving pharmacy: No He was unsure of pharmacy for the patient assistance program  Patient is anticipating a start date of: 09-20-17    Other Information:     Plan was routed - plan has already been routed to Dr. Edwin Dada.

## 2017-09-19 NOTE — Progress Notes
Pharmacy was consulted to increase the patient's Regorafenib (Stivarga) prescription to 120 mg (three 40 mg tabs) dose.  This was completed within [place].  The script was routed to [facility] for [employer] [facility] Patient Assistance.  [facility] was contacted to confirm they do accept e-scripts.    Ainsley Spinner, [facility]  Oncology Clinical Pharmacist  09/19/2017

## 2017-09-21 ENCOUNTER — Ambulatory Visit: Admit: 2017-09-21 | Discharge: 2017-09-21 | Payer: MEDICARE

## 2017-09-21 ENCOUNTER — Encounter: Admit: 2017-09-21 | Discharge: 2017-09-21 | Payer: MEDICARE

## 2017-09-21 DIAGNOSIS — F411 Generalized anxiety disorder: Principal | ICD-10-CM

## 2017-09-21 DIAGNOSIS — F4323 Adjustment disorder with mixed anxiety and depressed mood: ICD-10-CM

## 2017-09-21 MED ORDER — SERTRALINE 100 MG PO TAB
200 mg | ORAL_TABLET | Freq: Every day | ORAL | 1 refills | Status: AC
Start: 2017-09-21 — End: 2017-12-14

## 2017-09-21 MED ORDER — TRAZODONE 100 MG PO TAB
ORAL_TABLET | 1 refills | Status: AC | PRN
Start: 2017-09-21 — End: 2017-12-14

## 2017-09-21 MED ORDER — TRAZODONE 100 MG PO TAB
ORAL_TABLET | 1 refills | Status: AC | PRN
Start: 2017-09-21 — End: 2017-09-21

## 2017-09-21 MED ORDER — ALPRAZOLAM 0.5 MG PO TAB
.25-.5 mg | ORAL_TABLET | Freq: Every day | ORAL | 1 refills | Status: SS | PRN
Start: 2017-09-21 — End: 2018-03-03

## 2017-09-21 MED ORDER — SERTRALINE 100 MG PO TAB
200 mg | ORAL_TABLET | Freq: Every day | ORAL | 1 refills | Status: AC
Start: 2017-09-21 — End: 2017-09-21

## 2017-09-21 NOTE — Progress Notes
Subjective:       Willie Sandoval is a 71 y/o white male with a past psychiatric history of anxiety, depression, and alcohol use disorder who presented to clinic for schedule follow up.  Marland Kitchen   ???  He presents to clinic today with his wife who was present for the duration of the visit with his permission.    ???  Since his last visit, he reports he is doing ok.  His mood has been stable overall.  Having some stress related to his tumors which he feels has been helped by therapy with Dr. Deloria Lair.  Continues to drink alcohol approximately the same amount.  His wife reports that he is maintaining control of his drinking and doesn't feel that it is interfering with his life.  Tolerating medications without issues.  Denies SI/HI.  ???  Social history review:  Lives at home with his wife. Retired. Has several hobbies including working restoring the [employer].   Denies tobacco and drug use.  Continues to drink alcohol.      Willie Sandoval is a 71 y.o. male.       Review of Systems   Constitutional: Negative for fatigue.   Gastrointestinal:        Has GIST   Musculoskeletal: Positive for arthralgias, back pain and gait problem.   Psychiatric/Behavioral: Positive for sleep disturbance. Negative for agitation, behavioral problems, confusion, decreased concentration, dysphoric mood, hallucinations, self-injury and suicidal ideas. The patient is not nervous/anxious and is not hyperactive.          Objective:         ??? acetaminophen (TYLENOL) 500 mg tablet Take 500 mg by mouth every 6 hours as needed for Pain. Max of 4,000 mg of acetaminophen in 24 hours.   ??? ALPRAZolam (XANAX) 0.5 mg tablet Take 0.5-1 tablets by mouth daily as needed for Anxiety.   ??? aspirin EC 81 mg tablet Take 81 mg by mouth daily.   ??? CELECOXIB (CELEBREX PO) Take 1 tablet by mouth daily.   ??? cyanocobalamin(+) (VITAMIN B-12) 500 mcg tablet Take 500 mcg by mouth daily.   ??? cyclobenzaprine (FLEXERIL) 10 mg tablet Take one tablet by mouth twice daily as needed for Muscle Cramps.   ??? fish oil /omega-3 fatty acids (SEA-OMEGA) 340/1000 mg capsule Take 1 Cap by mouth daily.   ??? furosemide (LASIX) 40 mg tablet Take 40 mg by mouth daily.   ??? gabapentin (NEURONTIN) 400 mg capsule TAKE 2 CAPSULES BY MOUTH TWICE A DAY   ??? Garlic 1,000 mg cap Take 1 Cap by mouth daily.   ??? GLUCOSAMINE SULF/CHONDROITIN A (GLUCOSAMINE SULF-CHONDROITINSA PO) Take 1 tablet by mouth twice daily.   ??? levothyroxine (SYNTHROID) 112 mcg tablet Take one tablet by mouth daily 30 minutes before breakfast.   ??? lidocaine viscous-diphenhydrAMINE-alum/mag hydroxide/simeth 1:1:1 suspension Swish and Spit 10 mL by mouth as directed before meals and at bedtime.   ??? lidocaine/prilocaine (EMLA) 2.5/2.5 % topical cream Apply to port area 30 minutes prior to access   ??? lisinopril (PRINIVIL; ZESTRIL) 5 mg tablet Take 5 mg by mouth daily.   ??? magnesium oxide (MAG-OX) 400 mg tablet Take 400 mg by mouth daily.   ??? metoprolol (LOPRESSOR) 25 mg tablet Take 25 mg by mouth twice daily.   ??? MV with Min-Lycopene-Lutein (CENTRUM SILVER) 0.4-300-250 mg-mcg-mcg tab Take 1 Tab by mouth daily.   ??? ondansetron (ZOFRAN) 8 mg tablet Take 1 tablet by mouth every 8 hours as needed for Nausea.   ???  oxymetazoline (AFRIN) 0.05 % nasal spray Apply 1 spray to each nostril as directed twice daily as needed.   ??? potassium chloride SR (K-DUR) 20 mEq tablet Take 20 mEq by mouth daily. Take with a meal and a full glass of water.   ??? ranitidine(+) (ZANTAC) 300 mg tablet Take 300 mg by mouth daily.   ??? regorafenib (STIVARGA) 40 mg tablet Take 3 tabs by mouth daily on Days 1-21 of each 28-day cycle. Take at the same time each day with a low fat meal.   ??? sertraline (ZOLOFT) 100 mg tablet Take 2 tablets by mouth daily. 90 day supply   ??? simethicone (MYLICON) 80 mg chew tablet Take 1 Tab by mouth every 6 hours as needed. (Patient taking differently: Chew 80-160 mg by mouth daily as needed.) ??? sucralfate (CARAFATE) 1 gram tablet Take 1 g by mouth daily as needed (as direc). Take on an empty stomach.   ??? tamsulosin (FLOMAX) 0.4 mg capsule Take 1 Cap by mouth daily after breakfast.   ??? traMADol (ULTRAM) 50 mg tablet 1-2 tablets every 6 hours as needed for pain   ??? traZODone (DESYREL) 100 mg tablet Take 50 mg qhs.  May repeat an additional 50 mg qhs prn if not asleep in 1 hour.  90 day supply   ??? triamcinolone (NASACORT) 55 mcg nasal inhaler Apply 2 Sprays to each nostril as directed at bedtime as needed.   ??? vitamin E 400 unit capsule Take 400 Units by mouth daily.     There were no vitals filed for this visit.  There is no height or weight on file to calculate BMI.     Physical Exam   Psychiatric:   General/Constitutional: 71 y/o white male who appears his stated age.  Dressed in casual attire with appropriate grooming and hygiene.  Eye Contact: good  Behavior: calm and cooperative.  Pleasant to talk to  Speech: normal rate, rhythm, volume, and tone  Mood: fine  Affect: Euthymic.  Full range of affect  Thought Process: linear, logical, goal directed  Thought Content: Denies SI/HI  Perception: No Ah/VH voiced  Associations: intact  Insight: fair  Judgement: fair    Orientation: grossly intact  Recent and remote memory: intact  Attention span and concentration: appropriate  Cognition: alert  Language: fluent, [place] speaker  Fund of knowledge/vocabulary: appropriate    Gait: ambulates with a cane            Assessment and Plan:  Generalized Anxiety Disorder  Adjustment disorder with depressed mood  Alcohol use disorder, moderate    GIST of small bowel s/p resection, sleep apnea, HTN, HLD    PLAN:  - Continues to remain stable overall.    - Continue Sertraline 200 mg daily.  - Continue Trazodone 50-100 mg qhs prn for sleep.  - Continue Gabapentin.    Continue Alprazolam 0.25-0.5 mg daily as needed for anxiety with travel and procedures.  Instructed him to not mix Xanax and alcohol.  He and his wife expressed understanding.    - Encouraged continued use of CPAP machine.    - Continue therapy with Dr. Deloria Lair which he is finding beneficial.    Call clinic with questions or concerns.  Should suicidal thoughts, intentions or plans or homicidal thoughts, intentions or plans develop and/or worsen, please utilize one of the following resources including 911, ER, mental health provider or crisis hotline.    The proposed treatment plan was discussed with the patient/guardian who was provided the  opportunity to ask questions and make suggestions regarding alternative treatment.     Return to clinic in 12 weeks.

## 2017-09-21 NOTE — Telephone Encounter
Patient would like his prescriptions sent to [facility]) pharmacy, please.     Thank you!

## 2017-09-29 ENCOUNTER — Encounter: Admit: 2017-09-29 | Discharge: 2017-09-29 | Payer: MEDICARE

## 2017-09-29 MED ORDER — TRAMADOL 50 MG PO TAB
ORAL_TABLET | Freq: Four times a day (QID) | 0 refills | Status: AC | PRN
Start: 2017-09-29 — End: 2017-11-01

## 2017-09-29 NOTE — Telephone Encounter
Patient's wife left a voicemail asking for a refill of tramadol. I was unable to reach the wife so I called the patient. He will be in this afternoon to pick up the script.

## 2017-10-04 ENCOUNTER — Encounter: Admit: 2017-10-04 | Discharge: 2017-10-04 | Payer: MEDICARE

## 2017-10-04 DIAGNOSIS — E042 Nontoxic multinodular goiter: ICD-10-CM

## 2017-10-04 DIAGNOSIS — C49A3 Gastrointestinal stromal tumor of small intestine: Principal | ICD-10-CM

## 2017-10-04 DIAGNOSIS — E785 Hyperlipidemia, unspecified: ICD-10-CM

## 2017-10-04 DIAGNOSIS — I10 Essential (primary) hypertension: ICD-10-CM

## 2017-10-04 DIAGNOSIS — F329 Major depressive disorder, single episode, unspecified: ICD-10-CM

## 2017-10-04 DIAGNOSIS — M1990 Unspecified osteoarthritis, unspecified site: ICD-10-CM

## 2017-10-04 DIAGNOSIS — C787 Secondary malignant neoplasm of liver and intrahepatic bile duct: ICD-10-CM

## 2017-10-04 DIAGNOSIS — Z9221 Personal history of antineoplastic chemotherapy: ICD-10-CM

## 2017-10-04 DIAGNOSIS — F419 Anxiety disorder, unspecified: ICD-10-CM

## 2017-10-04 DIAGNOSIS — C171 Malignant neoplasm of jejunum: Principal | ICD-10-CM

## 2017-10-04 DIAGNOSIS — E119 Type 2 diabetes mellitus without complications: ICD-10-CM

## 2017-10-04 DIAGNOSIS — M7030 Other bursitis of elbow, unspecified elbow: ICD-10-CM

## 2017-10-04 LAB — COMPREHENSIVE METABOLIC PANEL
Lab: 1 mg/dL (ref 0.4–1.24)
Lab: 137 MMOL/L — ABNORMAL HIGH (ref 137–147)
Lab: 171 mg/dL — ABNORMAL HIGH (ref 70–100)
Lab: 18 mg/dL (ref 7–25)
Lab: 3.9 MMOL/L — ABNORMAL HIGH (ref 3.5–5.1)
Lab: 7.6 g/dL (ref 6.0–8.0)
Lab: 9.5 mg/dL (ref 8.5–10.6)

## 2017-10-04 LAB — CBC AND DIFF
Lab: 5.3 M/UL (ref 4.4–5.5)
Lab: 9.3 10*3/uL (ref 4.5–11.0)

## 2017-10-04 LAB — TSH WITH FREE T4 REFLEX: Lab: 4.6 uU/mL (ref 0.35–5.00)

## 2017-10-04 MED ORDER — HEPARIN, PORCINE (PF) 100 UNIT/ML IV SYRG
500 [IU] | Freq: Once | 0 refills | Status: CP
Start: 2017-10-04 — End: ?

## 2017-10-04 NOTE — Progress Notes
Pt here today for Port draw and flush and follow up with Dr. Edwin Dada.
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
## 2017-10-06 ENCOUNTER — Encounter: Admit: 2017-10-06 | Discharge: 2017-10-06 | Payer: MEDICARE

## 2017-10-06 DIAGNOSIS — E039 Hypothyroidism, unspecified: Principal | ICD-10-CM

## 2017-10-06 DIAGNOSIS — F4323 Adjustment disorder with mixed anxiety and depressed mood: Principal | ICD-10-CM

## 2017-10-06 NOTE — Telephone Encounter
Tried to reach pt or wife at phone # [phone]. Received message that voice mailbox was full, unable to leave message  Called [phone], LVM for return call to discuss results and POC

## 2017-10-06 NOTE — Telephone Encounter
Please let him know that TSH is okay and continue current dose.  Please order TSH with reflex FT4 for him to do 1-2 weeks before visit.  thanks

## 2017-10-09 NOTE — Progress Notes
CONFIDENTIAL  Follow-up note    PATIENT: Willie Sandoval  DOB: [birth year]  DOS:10/06/2017  TIME: 3:00-3:50pm  INTERVENTION: Supportive and CBT    SUMMARY:  Pt seen for follow-up psychotherapy.  Focus of discussion on patient's improvement in mood symptoms as he recently purchased an Transport planner and has been socially active as a result of the change in mobility.  Patient planning to go to [facility] for a second opinion.    MSE/ASSESSMENT: Patient alert and Ox3. Speech fluid. Thoughts lucid and w/o evidence of psychoses. Memory grossly intact. Patient cooperative and with appropriate eye contact. Mood calm with congruent affect. Insight, judgment, and impulse control sufficient. No SI/HI, plans, or intent endorsed at this time.     IMPRESSIONS (based on DSM V): Alcohol abuse           Adjustment disorder with mixed anxiety and depressed mood            Medical Diagnosis GIST    PLAN: CBT    Vita Erm, PhD  Licensed Psychologist  [phone]

## 2017-10-17 NOTE — Telephone Encounter
Sent out patient letter with lab requisition attached.    Closing encounter

## 2017-10-18 ENCOUNTER — Encounter: Admit: 2017-10-18 | Discharge: 2017-10-18 | Payer: MEDICARE

## 2017-10-18 DIAGNOSIS — C787 Secondary malignant neoplasm of liver and intrahepatic bile duct: ICD-10-CM

## 2017-10-18 DIAGNOSIS — M1990 Unspecified osteoarthritis, unspecified site: ICD-10-CM

## 2017-10-18 DIAGNOSIS — C49A3 Gastrointestinal stromal tumor of small intestine: Principal | ICD-10-CM

## 2017-10-18 DIAGNOSIS — F329 Major depressive disorder, single episode, unspecified: ICD-10-CM

## 2017-10-18 DIAGNOSIS — M7030 Other bursitis of elbow, unspecified elbow: ICD-10-CM

## 2017-10-18 DIAGNOSIS — C171 Malignant neoplasm of jejunum: Principal | ICD-10-CM

## 2017-10-18 DIAGNOSIS — Z9221 Personal history of antineoplastic chemotherapy: ICD-10-CM

## 2017-10-18 DIAGNOSIS — F419 Anxiety disorder, unspecified: ICD-10-CM

## 2017-10-18 DIAGNOSIS — I10 Essential (primary) hypertension: ICD-10-CM

## 2017-10-18 DIAGNOSIS — E785 Hyperlipidemia, unspecified: ICD-10-CM

## 2017-10-18 DIAGNOSIS — E119 Type 2 diabetes mellitus without complications: ICD-10-CM

## 2017-10-18 LAB — CBC AND DIFF
Lab: 0.1 10*3/uL (ref 0–0.20)
Lab: 12 10*3/uL — ABNORMAL HIGH (ref 4.5–11.0)
Lab: 5 M/UL (ref 4.4–5.5)

## 2017-10-18 MED ORDER — HEPARIN, PORCINE (PF) 100 UNIT/ML IV SYRG
500 [IU] | Freq: Once | INTRAVENOUS | 0 refills | Status: CP
Start: 2017-10-18 — End: ?
  Administered 2017-10-18: 21:00:00 500 [IU] via INTRAVENOUS

## 2017-10-18 NOTE — Progress Notes
PT here for port flush.  Pt states he feels well.  Port accessed, flushed.   Brisk blood return noted, labs drawn.  Port packed with heparin and deaccessed.  Pt tolerated well.  Pt discharged in stable, ambulatory condition to follow up.

## 2017-10-18 NOTE — Progress Notes
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 also commented that some of the liver lesions have increased slightly as well with 1 going from 2.6-3.2 cm but to my eye the liver lesions continued to appear fairly necrotic as well.   Continued on DART trial with good tolerance, with bump in creatinine, which responded well to hydration. Some dryness of his skin.  Placed on levothyroxine due to TSH abnormalities.  05/16/17 - restaging scans consistent with disease progression - with significant increased size of 1 of the liver lesions which previously was 3.2 cm now is 6.6 cm and some other liver lesions of increased and there were also some soft tissue nodules near prior surgical clips that of increased but interestingly enough the initial target lesion has continued to decrease slightly now down to 4.9 x 4.5 cm in certainly appears more necrotic.  June 2018 DC DART trial due to progression.  July 2018 - early in the month, he started regorafinib. Delay due to insurance issues. This tx complicated by hand-foot syndrome, and was held for 3 weeks. Unable to walk for 4 days. May have been an allergic response to Lanolin. Oral mucositis, and hair thinning are two other complaints.  07/21/17 - resumed regorafinib at 50% dose reduction, 2 tablets, with improved tolerance to drug. 09/13/17 - restaging CT scan shows progression - compared to June the dominant mass in segment 4A of the liver increased from 6.6 x 5.0 cm to 7.6 x 5.9 cm and other lesions had increased as well.  The dominant mesenteric mass increased from 3.7 x 3.1 cm to 6.1 x 4.6 cm there were some other lesions that were slightly larger and slightly smaller but overall would appear there is progression.  09/20/17 - increased regorafinib to 3 tablets      Plan:  ??? He will continue regorafinib 3 tabs q daily. (he started this cycle on 10/11/17). Tolerating it relatively well, with grade II hand/foot syndrome, and increasing fatigue.   ??? He is anxious for a second opinion as they are wanting to continue treatment. Expressed FU with DR. Powers, sarcoma team, or at [facility] to see if there is another clinical trial available.  ??? Referred to dR.  Powers via his nurse navigator, pending scheduling at this time.  ??? Heme: cbc reflecting leucocytosis, without fevers, chills or other infectious sx. Otherwise stable.   ??? CMP reflecting elevated BG (improved) and elevated ALKP - improving. Will repeat cbc/cmp in 2 weeks.  ??? DM II- recently started metformin 500 mg BID per PCP. He needs to follow up with diabetic educator regard diet, etc.   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 Reported to Dr. Benjiman Core and advised to continue at the same dose. Report if any worsening. thyroid nodules with abnormal TSH, T4 and T3 - placed on levothyroxine 112 mcg - endocrine following. Dr. Cherie Dark following.  Component      Latest Ref Rng & Units 10/04/2017 08/18/2017 07/21/2017 06/29/2017          10:01 AM  1:19 PM 11:23 AM  1:18 PM   TSH      0.35 - 5.00 MCU/ML 4.600 10.320 (H) 41.530 (H) 41.380 (H)     BLEE - completely resolved today, with reports of increasing urinary urgency and incontinence. Advised to hold lasix and KCL, to start taking it again if he starts retaining fluids. I will forward this note to cardiology.    RTC in 2 weeks to see Raynelle Fanning, aprn, with labs.  Call with any new or worsening symptoms

## 2017-10-19 ENCOUNTER — Encounter: Admit: 2017-10-19 | Discharge: 2017-10-19 | Payer: MEDICARE

## 2017-10-19 DIAGNOSIS — E119 Type 2 diabetes mellitus without complications: ICD-10-CM

## 2017-10-19 DIAGNOSIS — M1990 Unspecified osteoarthritis, unspecified site: ICD-10-CM

## 2017-10-19 DIAGNOSIS — F329 Major depressive disorder, single episode, unspecified: ICD-10-CM

## 2017-10-19 DIAGNOSIS — C787 Secondary malignant neoplasm of liver and intrahepatic bile duct: ICD-10-CM

## 2017-10-19 DIAGNOSIS — C49A3 Gastrointestinal stromal tumor of small intestine: Principal | ICD-10-CM

## 2017-10-19 DIAGNOSIS — Z9221 Personal history of antineoplastic chemotherapy: ICD-10-CM

## 2017-10-19 DIAGNOSIS — E785 Hyperlipidemia, unspecified: ICD-10-CM

## 2017-10-19 DIAGNOSIS — F419 Anxiety disorder, unspecified: ICD-10-CM

## 2017-10-19 DIAGNOSIS — C171 Malignant neoplasm of jejunum: Principal | ICD-10-CM

## 2017-10-19 DIAGNOSIS — L271 Localized skin eruption due to drugs and medicaments taken internally: ICD-10-CM

## 2017-10-19 DIAGNOSIS — M7030 Other bursitis of elbow, unspecified elbow: ICD-10-CM

## 2017-10-19 DIAGNOSIS — I10 Essential (primary) hypertension: ICD-10-CM

## 2017-10-19 LAB — COMPREHENSIVE METABOLIC PANEL
Lab: 1 mg/dL (ref 0.3–1.2)
Lab: 1.1 mg/dL (ref 0.4–1.24)
Lab: 10 10*3/uL (ref 3–12)
Lab: 101 MMOL/L (ref 98–110)
Lab: 111 U/L — ABNORMAL HIGH (ref 25–110)
Lab: 135 mg/dL — ABNORMAL HIGH (ref 70–100)
Lab: 137 MMOL/L (ref 137–147)
Lab: 16 mg/dL (ref 7–25)
Lab: 26 MMOL/L (ref 21–30)
Lab: 26 U/L — ABNORMAL HIGH (ref 7–56)
Lab: 30 U/L (ref 7–40)
Lab: 4.1 g/dL — ABNORMAL LOW (ref 3.5–5.0)
Lab: 4.3 MMOL/L (ref 3.5–5.1)
Lab: 7.4 g/dL (ref 6.0–8.0)
Lab: 9.9 mg/dL (ref 8.5–10.6)
Lab: >60 mL/min (ref >60)
Lab: >60 mL/min (ref >60)

## 2017-10-19 NOTE — Telephone Encounter
Received email referral from [facility] to Dr.Ben Powers. Called patient, reviewed appointment date, time and location. Verbalized understanding.     See Dr.Stuart Hinton's note from 10/04/17 for full history.    Chemo: Stivarga (current), Sutent (previously)  Radiation: None  Surgeries: Appendectomy, cholecystectomy, lumbar spine surgery, mastectomy (bilateral), tonsil/adenoidectomy, excision intestinal tumor, abdominal surgery  PMH: GIST, HTN,  Hyperlipidemia, arthritis, anxiety,     Social history: Patient is married Willie Sandoval) and lives in [place], [place] ([address] min [address] of [place]). Patient and his wife are retired. He previously worked as a Freight forwarder for [employer]. They have two adult children, one daughter in [place] and a son in [place]. Patient previously smoked 1 pack/day for 40+ years (quit in 2007), denies drug use. Patient admits to drinking 4-6 hard liquor drinks/day.

## 2017-10-19 NOTE — Progress Notes
Name: Willie Sandoval          MRN: [id number]      DOB: Nov 23, 1946      AGE: 71 y.o.   DATE OF SERVICE: 10/19/2017    Subjective:             Reason for Visit:  Heme/Onc Care      Willie Sandoval is a 71 y.o. male.     Cancer Staging  GIST (gastrointestinal stromal tumor) of small bowel, malignant  Staging form: Gastric Stromal Tumor - Small Intestine GIST, AJCC 7th Edition  - Clinical stage from 10/17/2013: T3N0M1 (T3, N0, M1b) - Unsigned  - Pathologic: [id number] (T3, N0, M1b) - Signed by Dipasco, Althea Grimmer, MD on 10/17/2013      History of Present Illness  His history is very well outlined in previous notes from Dr Benjiman Core. He comes in for another opinion as to what to do now. Basic story is that he has failed imatinib, sunitinib, regorafenib, and dual ipi/nivo trial. 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 He remains on 120mg  stivarga today, but this has not been easy on him. He has met with Dr Margarita Mail, last in Summer 2017, and he is wondering if she would have any trials available to him there.     Restaging CT of the abdomen and pelvis September 13, 2017, compared to June 2018, showed the dominant mass in segment 4A of the liver increased from 6.6 x 5.0 cm to 7.6 x 5.9 cm and other lesions had increased as well.  The dominant mesenteric mass increased from 3.7 x 3.1 cm to 6.1 x 4.6 cm. There were some other lesions that were slightly larger and slightly smaller but overall would appear there is progression.    Dr Benjiman Core has explored the early phase program but unfortunately there were no available trials.  There is a GIST-specific trial in the works here, but he would not qualify for it, as it would be for sunitinib-naive patients.     He has been having worsening bouts of confusion, worried about this cancer spreading to his brain.        Review of Systems   Constitutional: Positive for activity change, appetite change and fatigue. Negative for unexpected weight change.   HENT: Negative. Eyes: Negative.  Negative for visual disturbance.   Respiratory: Negative.  Negative for shortness of breath.    Cardiovascular: Negative.  Negative for chest pain and leg swelling.   Gastrointestinal: Positive for abdominal pain. Negative for abdominal distention, blood in stool, diarrhea, nausea and vomiting.   Endocrine: Negative.    Genitourinary: Negative.    Musculoskeletal: Negative.  Negative for arthralgias and myalgias.   Skin: Positive for rash.   Allergic/Immunologic: Negative.  Negative for immunocompromised state.   Neurological: Negative.  Negative for weakness, numbness and headaches.   Hematological: Negative for adenopathy. Does not bruise/bleed easily.   Psychiatric/Behavioral: Negative.  Negative for confusion, decreased concentration, dysphoric mood, self-injury and sleep disturbance.   All other systems reviewed and are negative.        Objective:         ??? acetaminophen (TYLENOL) 500 mg tablet Take 500 mg by mouth every 6 hours as needed for Pain. Max of 4,000 mg of acetaminophen in 24 hours.   ??? ALPRAZolam (XANAX) 0.5 mg tablet Take one-half tablet to one tablet by mouth daily as needed for Anxiety.   ??? aspirin EC 81 mg tablet Take 81 mg by mouth  daily.   ??? CELECOXIB (CELEBREX PO) Take 1 tablet by mouth daily.   ??? cyanocobalamin(+) (VITAMIN B-12) 500 mcg tablet Take 500 mcg by mouth daily.   ??? cyclobenzaprine (FLEXERIL) 10 mg tablet Take one tablet by mouth twice daily as needed for Muscle Cramps.   ??? fish oil /omega-3 fatty acids (SEA-OMEGA) 340/1000 mg capsule Take 1 Cap by mouth daily.   ??? furosemide (LASIX) 40 mg tablet Take 40 mg by mouth daily.   ??? gabapentin (NEURONTIN) 400 mg capsule TAKE 2 CAPSULES BY MOUTH TWICE A DAY   ??? Garlic 1,000 mg cap Take 1 Cap by mouth daily.   ??? GLUCOSAMINE SULF/CHONDROITIN A (GLUCOSAMINE SULF-CHONDROITINSA PO) Take 1 tablet by mouth twice daily.   ??? levothyroxine (SYNTHROID) 112 mcg tablet Take one tablet by mouth daily 30 minutes before breakfast. ??? lidocaine viscous-diphenhydrAMINE-alum/mag hydroxide/simeth 1:1:1 suspension Swish and Spit 10 mL by mouth as directed before meals and at bedtime.   ??? lidocaine/prilocaine (EMLA) 2.5/2.5 % topical cream Apply to port area 30 minutes prior to access   ??? lisinopril (PRINIVIL; ZESTRIL) 5 mg tablet Take 5 mg by mouth daily.   ??? magnesium oxide (MAG-OX) 400 mg tablet Take 400 mg by mouth daily.   ??? metFORMIN (GLUCOPHAGE) 500 mg tablet Take 500 mg by mouth twice daily with meals.   ??? metoprolol (LOPRESSOR) 25 mg tablet Take 25 mg by mouth twice daily.   ??? mirabegron (MYRBETRIQ PO) Take 50 mg by mouth daily.   ??? MV with Min-Lycopene-Lutein (CENTRUM SILVER) 0.4-300-250 mg-mcg-mcg tab Take 1 Tab by mouth daily.   ??? ondansetron (ZOFRAN) 8 mg tablet Take 1 tablet by mouth every 8 hours as needed for Nausea.   ??? oxymetazoline (AFRIN) 0.05 % nasal spray Apply 1 spray to each nostril as directed twice daily as needed.   ??? potassium chloride SR (K-DUR) 20 mEq tablet Take 20 mEq by mouth daily. Take with a meal and a full glass of water.   ??? ranitidine(+) (ZANTAC) 300 mg tablet Take 300 mg by mouth daily.   ??? regorafenib (STIVARGA) 40 mg tablet Take 3 tabs by mouth daily on Days 1-21 of each 28-day cycle. Take at the same time each day with a low fat meal.   ??? sertraline (ZOLOFT) 100 mg tablet Take two tablets by mouth daily. 90 day supply   ??? simethicone (MYLICON) 80 mg chew tablet Take 1 Tab by mouth every 6 hours as needed. (Patient taking differently: Chew 80-160 mg by mouth daily as needed.)   ??? sucralfate (CARAFATE) 1 gram tablet Take 1 g by mouth daily as needed (as direc). Take on an empty stomach.   ??? tamsulosin (FLOMAX) 0.4 mg capsule Take 1 Cap by mouth daily after breakfast.   ??? traMADol (ULTRAM) 50 mg tablet 1-2 tablets every 6 hours as needed for pain   ??? traZODone (DESYREL) 100 mg tablet Take 50 mg qhs.  May repeat an additional 50 mg qhs prn if not asleep in 1 hour.  90 day supply ??? triamcinolone (NASACORT) 55 mcg nasal inhaler Apply 2 Sprays to each nostril as directed at bedtime as needed.   ??? vitamin E 400 unit capsule Take 400 Units by mouth daily.     Vitals:    10/19/17 1346   BP: 129/88   Pulse: 66   Resp: 14   Temp: 36.3 ???C (97.4 ???F)   TempSrc: Oral   SpO2: 97%   Weight: 120.6 kg (265 lb 12.8 oz)  Height: 190.5 cm (75)     Body mass index is 33.22 kg/m???.     Pain Score: Three  Pain Loc: Back      Pain Addressed:  Current regimen working to control pain.    Patient Evaluated for a Clinical Trial: No treatment clinical trial available for this patient.     [place] Cooperative Oncology Group performance status is 1, Restricted in physically strenuous activity but ambulatory and able to carry out work of a light or sedentary nature, e.g., light house work, office work.     Physical Exam   Constitutional: He is oriented to person, place, and time. He appears well-developed and well-nourished. No distress.   HENT:   Head: Normocephalic and atraumatic.   Eyes: Conjunctivae are normal. No scleral icterus.   Neck: Neck supple. No thyromegaly present.   Cardiovascular: Normal rate, regular rhythm and normal heart sounds.    Pulmonary/Chest: Effort normal and breath sounds normal. No respiratory distress.   Abdominal: Soft. Bowel sounds are normal.   He is overweight but there are no palpable masses.   Musculoskeletal: Normal range of motion. He exhibits no edema.   Lymphadenopathy:     He has no cervical adenopathy.   Neurological: He is alert and oriented to person, place, and time.   Skin: Skin is warm and dry.   Peeling of hands/feet, which apparently is much better than before   Psychiatric: He has a normal mood and affect. His behavior is normal. Judgment and thought content normal.   Vitals reviewed.            Assessment and Plan:  Plan will be to stop Stivarga, in hopes of getting him back to [facility] asap to eval for any early phase or refractory GIST trials. I have touched base with Ray, Dr Andee Lineman nurse coordinator via phone re: this plan. They hope to send him an appt on Monday, once she gets back from out of country. Until then, work on scheduling MRI head w/ and w/o, and look to send tissue for NGS to see if any other mutations we could be targeting for personalized therapy.     Over 60 minutes spent reviewing his complicated medical history, discussing face-to-face other options, and figuring out attack plan from here.

## 2017-10-20 ENCOUNTER — Encounter: Admit: 2017-10-20 | Discharge: 2017-10-20 | Payer: MEDICARE

## 2017-10-20 NOTE — Progress Notes
[employer] [facility] testing form submitted online.

## 2017-10-26 NOTE — Progress Notes
Received document from [employer] that the specimen received was insufficient for the [facility]eme testing but there is sufficient specimen for PDL-1 testing which will be processed.

## 2017-10-31 ENCOUNTER — Ambulatory Visit: Admit: 2017-10-31 | Discharge: 2017-10-31 | Payer: MEDICARE

## 2017-10-31 DIAGNOSIS — C49A0 Gastrointestinal stromal tumor, unspecified site: Principal | ICD-10-CM

## 2017-10-31 MED ORDER — GADOBENATE DIMEGLUMINE 529 MG/ML (0.1MMOL/0.2ML) IV SOLN
20 mL | Freq: Once | INTRAVENOUS | 0 refills | Status: CP
Start: 2017-10-31 — End: ?
  Administered 2017-10-31: 20:00:00 20 mL via INTRAVENOUS

## 2017-10-31 MED ORDER — HEPARIN, PORCINE (PF) 100 UNIT/ML IV SYRG
500 [IU] | Freq: Once | 0 refills | Status: CP
Start: 2017-10-31 — End: ?

## 2017-11-01 ENCOUNTER — Encounter: Admit: 2017-11-01 | Discharge: 2017-11-01 | Payer: MEDICARE

## 2017-11-01 DIAGNOSIS — E785 Hyperlipidemia, unspecified: ICD-10-CM

## 2017-11-01 DIAGNOSIS — C787 Secondary malignant neoplasm of liver and intrahepatic bile duct: ICD-10-CM

## 2017-11-01 DIAGNOSIS — C49A3 Gastrointestinal stromal tumor of small intestine: Principal | ICD-10-CM

## 2017-11-01 DIAGNOSIS — C171 Malignant neoplasm of jejunum: Principal | ICD-10-CM

## 2017-11-01 DIAGNOSIS — L271 Localized skin eruption due to drugs and medicaments taken internally: ICD-10-CM

## 2017-11-01 DIAGNOSIS — Z006 Encounter for examination for normal comparison and control in clinical research program: ICD-10-CM

## 2017-11-01 DIAGNOSIS — E119 Type 2 diabetes mellitus without complications: ICD-10-CM

## 2017-11-01 DIAGNOSIS — M1990 Unspecified osteoarthritis, unspecified site: ICD-10-CM

## 2017-11-01 DIAGNOSIS — F329 Major depressive disorder, single episode, unspecified: ICD-10-CM

## 2017-11-01 DIAGNOSIS — F419 Anxiety disorder, unspecified: ICD-10-CM

## 2017-11-01 DIAGNOSIS — I10 Essential (primary) hypertension: ICD-10-CM

## 2017-11-01 DIAGNOSIS — M7030 Other bursitis of elbow, unspecified elbow: ICD-10-CM

## 2017-11-01 DIAGNOSIS — Z9221 Personal history of antineoplastic chemotherapy: ICD-10-CM

## 2017-11-01 LAB — CBC AND DIFF
Lab: 0.1 K/UL (ref 0–0.20)
Lab: 10 10*3/uL (ref 4.5–11.0)
Lab: 4.5 M/UL (ref 4.4–5.5)

## 2017-11-01 MED ORDER — HEPARIN, PORCINE (PF) 100 UNIT/ML IV SYRG
500 [IU] | Freq: Once | 0 refills | Status: CP
Start: 2017-11-01 — End: ?

## 2017-11-01 MED ORDER — TRAMADOL 50 MG PO TAB
ORAL_TABLET | Freq: Four times a day (QID) | 0 refills | Status: AC | PRN
Start: 2017-11-01 — End: 2017-11-30

## 2017-11-01 NOTE — Progress Notes
Port accessed, blood drawn, port flushed.  Pt is here with his wife.  Pt states he is doing pretty well and denies changes.  Pt is being seen by Almyra Free, APRN.    Pt discharged from stable condition.

## 2017-11-01 NOTE — Progress Notes
Name: Willie Sandoval          MRN: [id number]      DOB: [birth year]      AGE: 71 y.o.   DATE OF SERVICE: 11/01/2017    Subjective:             Reason for Visit:  Heme/Onc Care      Willie Sandoval is a 71 y.o. male.     Cancer Staging  GIST (gastrointestinal stromal tumor) of small bowel, malignant  Staging form: Gastric Stromal Tumor - Small Intestine GIST, AJCC 7th Edition  - Clinical stage from 10/17/2013: T3N0M1 (T3, N0, M1b) - Unsigned  - Pathologic: [id number] (T3, N0, M1b) - Signed by Dipasco, Althea Grimmer, MD on 10/17/2013      History of Present Illness  His history is very well outlined in previous notes from Dr Benjiman Core. He comes in for another opinion as to what to do now. Basic story is that he has failed imatinib, sunitinib, regorafenib, and dual ipi/nivo trial. 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 He remains on 120mg  stivarga today, but this has not been easy on him. He has met with Dr Margarita Mail, last in Summer 2017, and he is wondering if she would have any trials available to him there.   ???  Restaging CT of the abdomen and pelvis September 13, 2017, compared to June 2018, showed the dominant mass in segment 4A of the liver increased from 6.6 x 5.0 cm to 7.6 x 5.9 cm and other lesions had increased as well.  The dominant mesenteric mass increased from 3.7 x 3.1 cm to 6.1 x 4.6 cm. There were some other lesions that were slightly larger and slightly smaller but overall would appear there is progression.  ???  Dr Benjiman Core has explored the early phase program but unfortunately there were no available trials.  There is a GIST-specific trial in the works here, but he would not qualify for it, as it would be for sunitinib-naive patients.   ???  He has been having worsening bouts of confusion, worried about this cancer spreading to his brain.     Since his visit with Dr. Lorenso Courier, he stopped Stivarga and is feeling better off it. He talked with [facility], but the MD he was supposed to see was out of town and he hasn't heard back. They are asking for MRI results.        Review of Systems   All other systems reviewed and are negative.        Objective:         ??? acetaminophen (TYLENOL) 500 mg tablet Take 500 mg by mouth every 6 hours as needed for Pain. Max of 4,000 mg of acetaminophen in 24 hours.   ??? ALPRAZolam (XANAX) 0.5 mg tablet Take one-half tablet to one tablet by mouth daily as needed for Anxiety.   ??? aspirin EC 81 mg tablet Take 81 mg by mouth daily.   ??? CELECOXIB (CELEBREX PO) Take 1 tablet by mouth daily.   ??? cyanocobalamin(+) (VITAMIN B-12) 500 mcg tablet Take 500 mcg by mouth daily.   ??? cyclobenzaprine (FLEXERIL) 10 mg tablet Take one tablet by mouth twice daily as needed for Muscle Cramps.   ??? fish oil /omega-3 fatty acids (SEA-OMEGA) 340/1000 mg capsule Take 1 Cap by mouth daily.   ??? gabapentin (NEURONTIN) 400 mg capsule TAKE 2 CAPSULES BY MOUTH TWICE A DAY   ??? Garlic 1,000 mg cap Take 1 Cap by  mouth daily.   ??? GLUCOSAMINE SULF/CHONDROITIN A (GLUCOSAMINE SULF-CHONDROITINSA PO) Take 1 tablet by mouth twice daily.   ??? levothyroxine (SYNTHROID) 112 mcg tablet Take one tablet by mouth daily 30 minutes before breakfast.   ??? lidocaine viscous-diphenhydrAMINE-alum/mag hydroxide/simeth 1:1:1 suspension Swish and Spit 10 mL by mouth as directed before meals and at bedtime.   ??? lidocaine/prilocaine (EMLA) 2.5/2.5 % topical cream Apply to port area 30 minutes prior to access   ??? lisinopril (PRINIVIL; ZESTRIL) 5 mg tablet Take 5 mg by mouth daily.   ??? magnesium oxide (MAG-OX) 400 mg tablet Take 400 mg by mouth daily.   ??? metoprolol (LOPRESSOR) 25 mg tablet Take 25 mg by mouth twice daily.   ??? mirabegron (MYRBETRIQ PO) Take 50 mg by mouth daily.   ??? MV with Min-Lycopene-Lutein (CENTRUM SILVER) 0.4-300-250 mg-mcg-mcg tab Take 1 Tab by mouth daily.   ??? ondansetron (ZOFRAN) 8 mg tablet Take 1 tablet by mouth every 8 hours as needed for Nausea. ??? oxymetazoline (AFRIN) 0.05 % nasal spray Apply 1 spray to each nostril as directed twice daily as needed.   ??? ranitidine(+) (ZANTAC) 300 mg tablet Take 300 mg by mouth daily.   ??? regorafenib (STIVARGA) 40 mg tablet Take 3 tabs by mouth daily on Days 1-21 of each 28-day cycle. Take at the same time each day with a low fat meal.   ??? sertraline (ZOLOFT) 100 mg tablet Take two tablets by mouth daily. 90 day supply   ??? simethicone (MYLICON) 80 mg chew tablet Take 1 Tab by mouth every 6 hours as needed. (Patient taking differently: Chew 80-160 mg by mouth daily as needed.)   ??? sucralfate (CARAFATE) 1 gram tablet Take 1 g by mouth daily as needed (as direc). Take on an empty stomach.   ??? tamsulosin (FLOMAX) 0.4 mg capsule Take 1 Cap by mouth daily after breakfast.   ??? traMADol (ULTRAM) 50 mg tablet 1-2 tablets every 6 hours as needed for pain   ??? traZODone (DESYREL) 100 mg tablet Take 50 mg qhs.  May repeat an additional 50 mg qhs prn if not asleep in 1 hour.  90 day supply   ??? triamcinolone (NASACORT) 55 mcg nasal inhaler Apply 2 Sprays to each nostril as directed at bedtime as needed.   ??? vitamin E 400 unit capsule Take 400 Units by mouth daily.     Vitals:    11/01/17 1455   BP: 141/69   Pulse: 67   Resp: 14   Temp: 36.4 ???C (97.5 ???F)   TempSrc: Oral   SpO2: 97%   Weight: 123.2 kg (271 lb 9.6 oz)   Height: 190.5 cm (75)     Body mass index is 33.95 kg/m???.     Pain Score: Three  Pain Loc: Back      Pain Addressed:  Prescription provided for pain management and Current regimen working to control pain.    Patient Evaluated for a Clinical Trial: No treatment clinical trial available for this patient.     [place] Cooperative Oncology Group performance status is 1, Restricted in physically strenuous activity but ambulatory and able to carry out work of a light or sedentary nature, e.g., light house work, office work.     Physical Exam     Constitutional: He is oriented to person, place, and time. He appears well-developed and well-nourished. No distress.   HENT:   Head: Normocephalic and atraumatic.   Eyes: Conjunctivae are normal. No scleral icterus.   Neck: Neck  supple. No thyromegaly present.   Cardiovascular: Normal rate, regular rhythm and normal heart sounds.    Pulmonary/Chest: Effort normal and breath sounds normal. No respiratory distress.   Abdominal: Soft. Bowel sounds are normal.   He is overweight but there are no palpable masses.   Musculoskeletal: Normal range of motion. He exhibits no edema.   Lymphadenopathy:     He has no cervical adenopathy.   Neurological: He is alert and oriented to person, place, and time.   Skin: Skin is warm and dry.   Psychiatric: He has a normal mood and affect. His behavior is normal. Judgment and thought content normal.   Vitals reviewed.    MRI 10/31/17  IMPRESSION      1. ???No acute intracranial pathology or evidence of cerebral metastatic   disease.  2. 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  3. ???Reduced size of an enhancing right suboccipital nodule or lymph node   from comparison PET/CT. Given current imaging characteristics, lack of   growth, and absence of glycolytic activity on comparison PET/CT, this is   favored to be benign in nature.     Assessment and Plan:       1. GIST    PLAN: He and his wife have different opinions about whether they will follow with Dr. Lorenso Courier or Dr. Benjiman Core which they need to further discuss. I reviewed MRI and CBC with them and they were given a copy of both. I will check with Dr. Lorenso Courier regarding [facility]. Questions and concerns addressed.

## 2017-11-02 ENCOUNTER — Encounter: Admit: 2017-11-02 | Discharge: 2017-11-02 | Payer: MEDICARE

## 2017-11-02 LAB — COMPREHENSIVE METABOLIC PANEL
Lab: 0.8 mg/dL (ref 0.3–1.2)
Lab: 0.8 mg/dL (ref 0.4–1.24)
Lab: 107 MMOL/L (ref 98–110)
Lab: 14 mg/dL (ref 7–25)
Lab: 142 MMOL/L (ref 137–147)
Lab: 148 mg/dL — ABNORMAL HIGH (ref 70–100)
Lab: 17 U/L — ABNORMAL HIGH (ref 7–56)
Lab: 27 MMOL/L (ref 21–30)
Lab: 27 U/L (ref 7–40)
Lab: 3.8 g/dL — ABNORMAL LOW (ref 3.5–5.0)
Lab: 4 MMOL/L (ref 3.5–5.1)
Lab: 6.9 g/dL (ref 6.0–8.0)
Lab: 8 K/UL (ref 3–12)
Lab: 9.2 mg/dL (ref 8.5–10.6)
Lab: 97 U/L (ref 25–110)
Lab: >60 mL/min (ref >60)
Lab: >60 mL/min — ABNORMAL HIGH (ref >60)

## 2017-11-02 NOTE — Progress Notes
Discontinued from Oral Oncology Patient Management Program    Willie Sandoval has been removed from the Oral Oncology Patient Management Program due to toxicity.  Patient has discontinued taking Regorafenib (Stivarga).      The patient's provider, Dr. Prince Rome, has noted the above information in their note.        The patient may be re-enrolled at any time by contacting [phone].      Ainsley Spinner, [facility], [place]  Clinical Pharmacist  11/02/2017

## 2017-11-02 NOTE — Telephone Encounter
I called and spoke with Willie Sandoval and his wife this morning. Willie Sandoval said they received a call from [facility] stating he had an appointment scheduled this Sunday at 7 am. They are awaiting a call from [facility] to reschedule this appointment as they are unable to make the appointment this Sunday. I recommended they call [facility] to reschedule the appointment instead of waiting, but they prefer to wait until Monday to call [facility].     I spoke with Willie Sandoval and Willie Sandoval and Willie Sandoval said their plan is to transfer of care to Dr. Prince Rome. [name], Willie Sandoval was informed of the TOC.

## 2017-11-22 ENCOUNTER — Encounter: Admit: 2017-11-22 | Discharge: 2017-11-22 | Payer: MEDICARE

## 2017-11-28 ENCOUNTER — Encounter: Admit: 2017-11-28 | Discharge: 2017-11-28 | Payer: MEDICARE

## 2017-11-28 DIAGNOSIS — F4323 Adjustment disorder with mixed anxiety and depressed mood: ICD-10-CM

## 2017-11-28 DIAGNOSIS — F1010 Alcohol abuse, uncomplicated: Principal | ICD-10-CM

## 2017-11-29 ENCOUNTER — Encounter: Admit: 2017-11-29 | Discharge: 2017-11-29 | Payer: MEDICARE

## 2017-11-30 ENCOUNTER — Encounter: Admit: 2017-11-30 | Discharge: 2017-11-30 | Payer: MEDICARE

## 2017-11-30 ENCOUNTER — Encounter: Admit: 2017-11-30 | Discharge: 2017-12-01 | Payer: MEDICARE

## 2017-11-30 DIAGNOSIS — E042 Nontoxic multinodular goiter: Principal | ICD-10-CM

## 2017-11-30 DIAGNOSIS — C787 Secondary malignant neoplasm of liver and intrahepatic bile duct: ICD-10-CM

## 2017-11-30 LAB — CBC AND DIFF
Lab: 0.1 10*3/uL (ref 0–0.20)
Lab: 0.5 10*3/uL — ABNORMAL HIGH (ref 0–0.45)
Lab: 0.8 10*3/uL (ref 0–0.80)
Lab: 1 % (ref 0–2)
Lab: 1.8 10*3/uL (ref 1.0–4.8)
Lab: 14 % (ref >60)
Lab: 14 g/dL — ABNORMAL HIGH (ref 13.5–16.5)
Lab: 165 10*3/uL (ref >60)
Lab: 22 % — ABNORMAL LOW (ref 24–44)
Lab: 31 pg (ref 26–34)
Lab: 33 g/dL (ref 32.0–36.0)
Lab: 4.7 M/UL (ref 4.4–5.5)
Lab: 44 % (ref 40–50)
Lab: 5.1 10*3/uL (ref 1.8–7.0)
Lab: 6 % — ABNORMAL HIGH (ref 0–5)
Lab: 62 % (ref 41–77)
Lab: 8.1 FL (ref 7–11)
Lab: 8.3 10*3/uL (ref 4.5–11.0)
Lab: 9 % (ref 4–12)
Lab: 94 FL (ref 80–100)

## 2017-11-30 LAB — COMPREHENSIVE METABOLIC PANEL
Lab: 1 mg/dL (ref 0.4–1.24)
Lab: 10 mg/dL (ref 8.5–10.6)
Lab: 103 MMOL/L (ref 98–110)
Lab: 139 MMOL/L (ref 137–147)
Lab: 14 mg/dL (ref 7–25)
Lab: 170 mg/dL — ABNORMAL HIGH (ref 70–100)
Lab: 4 MMOL/L (ref 3.5–5.1)
Lab: 7 g/dL (ref 6.0–8.0)

## 2017-11-30 LAB — FREE T4-FREE THYROXINE: Lab: 1.5 ng/dL (ref 0.6–1.6)

## 2017-11-30 LAB — TSH WITH FREE T4 REFLEX: Lab: 6.5 uU/mL — ABNORMAL HIGH (ref 0.35–5.00)

## 2017-11-30 MED ORDER — TRAMADOL 50 MG PO TAB
ORAL_TABLET | Freq: Four times a day (QID) | 0 refills | Status: AC | PRN
Start: 2017-11-30 — End: 2017-12-28

## 2017-11-30 MED ORDER — HEPARIN, PORCINE (PF) 100 UNIT/ML IV SYRG
500 [IU] | Freq: Once | 0 refills | Status: CP
Start: 2017-11-30 — End: ?

## 2017-11-30 NOTE — Progress Notes
Port accessed, blood drawn, port flushed.  Pt is with his wife.  Pt states he is doing well and does have constipation currently.  Pt instructed to use Miralax daily instead of occasionally.  Pt verbalized understanding.    Pt discharged in stable condition.

## 2017-11-30 NOTE — Progress Notes
SPOKE WITH [name] ADVISED TO SCHEDULE PATIENT FOR PORT LABS/ AND FLUSH WILL SEE POWERS IN JAN AFTER PATIENT SEES [facility] S[facility]

## 2017-12-02 NOTE — Progress Notes
CONFIDENTIAL  Follow-up note    PATIENT: Willie Sandoval  DOB: 1946/10/24  DOS:11/28/2017  TIME: 3:00-3:50pm  INTERVENTION: Supportive and CBT    SUMMARY:  Pt seen for follow-up psychotherapy.  Focus of discussion on patient's low mood and anxiety regarding his health and worry about his wife's mood.  CBT skills reviewed and plans for holidays reviewed.  He endorsed drinking one to two shots more per night.  Patient actively engaged in therapy.    MSE/ASSESSMENT: Patient alert and Ox3. Speech fluid. Thoughts lucid and w/o evidence of psychoses. Memory grossly intact. Patient cooperative and with appropriate eye contact. Mood calm with congruent affect. Insight, judgment, and impulse control sufficient. No SI/HI, plans, or intent endorsed at this time.     IMPRESSIONS (based on DSM V): Alcohol abuse           Adjustment disorder with mixed anxiety and depressed mood            Medical Diagnosis GIST    PLAN: CBT    Vita Erm, PhD  Licensed Psychologist  [phone]

## 2017-12-06 ENCOUNTER — Encounter: Admit: 2017-12-06 | Discharge: 2017-12-06 | Payer: MEDICARE

## 2017-12-07 ENCOUNTER — Encounter: Admit: 2017-12-07 | Discharge: 2017-12-07 | Payer: MEDICARE

## 2017-12-07 NOTE — Progress Notes
Message sent to pt via MyChart  Closing this message.

## 2017-12-07 NOTE — Progress Notes
Was it time to repeat thyroid labs? If so, I'd like for you to do them again a few days after stopping the biotin.  If it wasn't time to do thyroid labs then I wouldn't worry about these results, but still plan to stop the biotin before they are done next time.

## 2017-12-09 ENCOUNTER — Encounter: Admit: 2017-12-09 | Discharge: 2017-12-09 | Payer: MEDICARE

## 2017-12-09 NOTE — Telephone Encounter
Call to [facility] to follow up on testing because portal states order on hold. [facility] stated that tissue insufficient for testing. They need an alternate specimen to proceed.

## 2017-12-14 ENCOUNTER — Ambulatory Visit: Admit: 2017-12-14 | Discharge: 2017-12-14 | Payer: MEDICARE

## 2017-12-14 DIAGNOSIS — F411 Generalized anxiety disorder: ICD-10-CM

## 2017-12-14 DIAGNOSIS — F1020 Alcohol dependence, uncomplicated: ICD-10-CM

## 2017-12-14 DIAGNOSIS — F4323 Adjustment disorder with mixed anxiety and depressed mood: Principal | ICD-10-CM

## 2017-12-14 MED ORDER — SERTRALINE 100 MG PO TAB
200 mg | ORAL_TABLET | Freq: Every day | ORAL | 1 refills | Status: AC
Start: 2017-12-14 — End: ?

## 2017-12-14 MED ORDER — GABAPENTIN 400 MG PO CAP
ORAL_CAPSULE | Freq: Two times a day (BID) | 1 refills | Status: SS
Start: 2017-12-14 — End: 2018-03-03

## 2017-12-14 MED ORDER — TRAZODONE 100 MG PO TAB
ORAL_TABLET | 1 refills | Status: AC | PRN
Start: 2017-12-14 — End: ?

## 2017-12-15 ENCOUNTER — Encounter: Admit: 2017-12-15 | Discharge: 2017-12-15 | Payer: MEDICARE

## 2017-12-15 ENCOUNTER — Ambulatory Visit: Admit: 2017-12-15 | Discharge: 2017-12-16 | Payer: MEDICARE

## 2017-12-15 DIAGNOSIS — M1990 Unspecified osteoarthritis, unspecified site: ICD-10-CM

## 2017-12-15 DIAGNOSIS — M7030 Other bursitis of elbow, unspecified elbow: ICD-10-CM

## 2017-12-15 DIAGNOSIS — L271 Localized skin eruption due to drugs and medicaments taken internally: ICD-10-CM

## 2017-12-15 DIAGNOSIS — I10 Essential (primary) hypertension: ICD-10-CM

## 2017-12-15 DIAGNOSIS — F419 Anxiety disorder, unspecified: ICD-10-CM

## 2017-12-15 DIAGNOSIS — M5416 Radiculopathy, lumbar region: Secondary | ICD-10-CM

## 2017-12-15 DIAGNOSIS — E038 Other specified hypothyroidism: Principal | ICD-10-CM

## 2017-12-15 DIAGNOSIS — C171 Malignant neoplasm of jejunum: Principal | ICD-10-CM

## 2017-12-15 DIAGNOSIS — E785 Hyperlipidemia, unspecified: ICD-10-CM

## 2017-12-15 DIAGNOSIS — F329 Major depressive disorder, single episode, unspecified: ICD-10-CM

## 2017-12-15 DIAGNOSIS — E119 Type 2 diabetes mellitus without complications: ICD-10-CM

## 2017-12-15 DIAGNOSIS — Z9221 Personal history of antineoplastic chemotherapy: ICD-10-CM

## 2017-12-15 MED ORDER — IOPAMIDOL 41 % IT SOLN
2.5 mL | Freq: Once | EPIDURAL | 0 refills | Status: CP
Start: 2017-12-15 — End: ?
  Administered 2017-12-15: 17:00:00 2.5 mL via EPIDURAL

## 2017-12-15 MED ORDER — TRIAMCINOLONE ACETONIDE 40 MG/ML IJ SUSP
80 mg | Freq: Once | EPIDURAL | 0 refills | Status: CP
Start: 2017-12-15 — End: ?
  Administered 2017-12-15: 17:00:00 80 mg via EPIDURAL

## 2017-12-16 DIAGNOSIS — Z85068 Personal history of other malignant neoplasm of small intestine: ICD-10-CM

## 2017-12-16 DIAGNOSIS — M5416 Radiculopathy, lumbar region: ICD-10-CM

## 2017-12-16 DIAGNOSIS — E119 Type 2 diabetes mellitus without complications: ICD-10-CM

## 2017-12-16 DIAGNOSIS — I10 Essential (primary) hypertension: ICD-10-CM

## 2017-12-16 DIAGNOSIS — E785 Hyperlipidemia, unspecified: ICD-10-CM

## 2017-12-16 DIAGNOSIS — Z87891 Personal history of nicotine dependence: ICD-10-CM

## 2017-12-16 DIAGNOSIS — M5116 Intervertebral disc disorders with radiculopathy, lumbar region: Principal | ICD-10-CM

## 2017-12-28 ENCOUNTER — Encounter: Admit: 2017-12-28 | Discharge: 2017-12-28 | Payer: MEDICARE

## 2017-12-28 DIAGNOSIS — C49A0 Gastrointestinal stromal tumor, unspecified site: Principal | ICD-10-CM

## 2017-12-28 MED ORDER — TRAMADOL 50 MG PO TAB
ORAL_TABLET | Freq: Four times a day (QID) | 3 refills | Status: AC | PRN
Start: 2017-12-28 — End: 2018-03-08

## 2017-12-28 MED ORDER — TRAMADOL 50 MG PO TAB
ORAL_TABLET | Freq: Four times a day (QID) | 3 refills | Status: AC | PRN
Start: 2017-12-28 — End: 2017-12-28

## 2017-12-29 ENCOUNTER — Encounter: Admit: 2017-12-29 | Discharge: 2017-12-29 | Payer: MEDICARE

## 2017-12-29 DIAGNOSIS — C49A3 Gastrointestinal stromal tumor of small intestine: Principal | ICD-10-CM

## 2017-12-29 DIAGNOSIS — F4323 Adjustment disorder with mixed anxiety and depressed mood: ICD-10-CM

## 2017-12-29 DIAGNOSIS — F1010 Alcohol abuse, uncomplicated: ICD-10-CM

## 2018-01-09 ENCOUNTER — Encounter: Admit: 2018-01-09 | Discharge: 2018-01-09 | Payer: MEDICARE

## 2018-01-09 ENCOUNTER — Encounter: Admit: 2018-01-09 | Discharge: 2018-01-10 | Payer: MEDICARE

## 2018-01-09 DIAGNOSIS — I10 Essential (primary) hypertension: ICD-10-CM

## 2018-01-09 DIAGNOSIS — Z9221 Personal history of antineoplastic chemotherapy: ICD-10-CM

## 2018-01-09 DIAGNOSIS — C171 Malignant neoplasm of jejunum: Principal | ICD-10-CM

## 2018-01-09 DIAGNOSIS — M1990 Unspecified osteoarthritis, unspecified site: ICD-10-CM

## 2018-01-09 DIAGNOSIS — C49A3 Gastrointestinal stromal tumor of small intestine: Principal | ICD-10-CM

## 2018-01-09 DIAGNOSIS — L271 Localized skin eruption due to drugs and medicaments taken internally: ICD-10-CM

## 2018-01-09 DIAGNOSIS — E785 Hyperlipidemia, unspecified: ICD-10-CM

## 2018-01-09 DIAGNOSIS — F419 Anxiety disorder, unspecified: ICD-10-CM

## 2018-01-09 DIAGNOSIS — E119 Type 2 diabetes mellitus without complications: ICD-10-CM

## 2018-01-09 DIAGNOSIS — F329 Major depressive disorder, single episode, unspecified: ICD-10-CM

## 2018-01-09 DIAGNOSIS — M7030 Other bursitis of elbow, unspecified elbow: ICD-10-CM

## 2018-01-09 DIAGNOSIS — E038 Other specified hypothyroidism: ICD-10-CM

## 2018-01-09 MED ORDER — HEPARIN, PORCINE (PF) 100 UNIT/ML IV SYRG
500 [IU] | Freq: Once | 0 refills | Status: AC
Start: 2018-01-09 — End: ?

## 2018-01-10 ENCOUNTER — Encounter: Admit: 2018-01-10 | Discharge: 2018-01-10 | Payer: MEDICARE

## 2018-01-10 DIAGNOSIS — C49A0 Gastrointestinal stromal tumor, unspecified site: Principal | ICD-10-CM

## 2018-01-10 MED ORDER — PAZOPANIB 200 MG PO TAB
ORAL_TABLET | Freq: Every day | ORAL | 0 refills | Status: AC
Start: 2018-01-10 — End: ?

## 2018-01-11 ENCOUNTER — Encounter: Admit: 2018-01-11 | Discharge: 2018-01-11 | Payer: MEDICARE

## 2018-01-16 ENCOUNTER — Encounter: Admit: 2018-01-16 | Discharge: 2018-01-16 | Payer: MEDICARE

## 2018-01-16 DIAGNOSIS — C787 Secondary malignant neoplasm of liver and intrahepatic bile duct: ICD-10-CM

## 2018-01-16 DIAGNOSIS — L271 Localized skin eruption due to drugs and medicaments taken internally: ICD-10-CM

## 2018-01-16 DIAGNOSIS — Z9221 Personal history of antineoplastic chemotherapy: ICD-10-CM

## 2018-01-16 DIAGNOSIS — E785 Hyperlipidemia, unspecified: ICD-10-CM

## 2018-01-16 DIAGNOSIS — C171 Malignant neoplasm of jejunum: ICD-10-CM

## 2018-01-16 DIAGNOSIS — M7030 Other bursitis of elbow, unspecified elbow: ICD-10-CM

## 2018-01-16 DIAGNOSIS — F329 Major depressive disorder, single episode, unspecified: ICD-10-CM

## 2018-01-16 DIAGNOSIS — M1990 Unspecified osteoarthritis, unspecified site: ICD-10-CM

## 2018-01-16 DIAGNOSIS — E119 Type 2 diabetes mellitus without complications: ICD-10-CM

## 2018-01-16 DIAGNOSIS — C49A3 Gastrointestinal stromal tumor of small intestine: Principal | ICD-10-CM

## 2018-01-16 DIAGNOSIS — F419 Anxiety disorder, unspecified: ICD-10-CM

## 2018-01-16 DIAGNOSIS — I10 Essential (primary) hypertension: ICD-10-CM

## 2018-01-17 ENCOUNTER — Encounter: Admit: 2018-01-17 | Discharge: 2018-01-17 | Payer: MEDICARE

## 2018-01-18 ENCOUNTER — Ambulatory Visit: Admit: 2018-01-18 | Discharge: 2018-01-18 | Payer: MEDICARE

## 2018-01-18 DIAGNOSIS — C49A0 Gastrointestinal stromal tumor, unspecified site: Principal | ICD-10-CM

## 2018-01-18 MED ORDER — PERFLUTREN LIPID MICROSPHERES 1.1 MG/ML IV SUSP
1-20 mL | Freq: Once | INTRAVENOUS | 0 refills | Status: CP
Start: 2018-01-18 — End: ?
  Administered 2018-01-18: 17:00:00 2 mL via INTRAVENOUS

## 2018-01-19 ENCOUNTER — Encounter: Admit: 2018-01-19 | Discharge: 2018-01-19 | Payer: MEDICARE

## 2018-01-19 DIAGNOSIS — C49A3 Gastrointestinal stromal tumor of small intestine: ICD-10-CM

## 2018-01-19 DIAGNOSIS — F4323 Adjustment disorder with mixed anxiety and depressed mood: Principal | ICD-10-CM

## 2018-01-23 ENCOUNTER — Encounter: Admit: 2018-01-23 | Discharge: 2018-01-23 | Payer: MEDICARE

## 2018-01-25 ENCOUNTER — Ambulatory Visit: Admit: 2018-01-25 | Discharge: 2018-01-25 | Payer: MEDICARE

## 2018-01-25 ENCOUNTER — Encounter: Admit: 2018-01-25 | Discharge: 2018-01-25 | Payer: MEDICARE

## 2018-01-25 DIAGNOSIS — E785 Hyperlipidemia, unspecified: ICD-10-CM

## 2018-01-25 DIAGNOSIS — C171 Malignant neoplasm of jejunum: Principal | ICD-10-CM

## 2018-01-25 DIAGNOSIS — M7030 Other bursitis of elbow, unspecified elbow: ICD-10-CM

## 2018-01-25 DIAGNOSIS — F419 Anxiety disorder, unspecified: ICD-10-CM

## 2018-01-25 DIAGNOSIS — M1990 Unspecified osteoarthritis, unspecified site: ICD-10-CM

## 2018-01-25 DIAGNOSIS — I10 Essential (primary) hypertension: ICD-10-CM

## 2018-01-25 DIAGNOSIS — E119 Type 2 diabetes mellitus without complications: ICD-10-CM

## 2018-01-25 DIAGNOSIS — L271 Localized skin eruption due to drugs and medicaments taken internally: ICD-10-CM

## 2018-01-25 DIAGNOSIS — Z9221 Personal history of antineoplastic chemotherapy: ICD-10-CM

## 2018-01-25 DIAGNOSIS — F1020 Alcohol dependence, uncomplicated: ICD-10-CM

## 2018-01-25 DIAGNOSIS — F4323 Adjustment disorder with mixed anxiety and depressed mood: ICD-10-CM

## 2018-01-25 DIAGNOSIS — F411 Generalized anxiety disorder: Principal | ICD-10-CM

## 2018-01-25 DIAGNOSIS — F329 Major depressive disorder, single episode, unspecified: ICD-10-CM

## 2018-02-07 ENCOUNTER — Encounter: Admit: 2018-02-07 | Discharge: 2018-02-07 | Payer: MEDICARE

## 2018-02-09 ENCOUNTER — Encounter: Admit: 2018-02-09 | Discharge: 2018-02-09 | Payer: MEDICARE

## 2018-02-10 ENCOUNTER — Encounter: Admit: 2018-02-10 | Discharge: 2018-02-10 | Payer: MEDICARE

## 2018-02-13 ENCOUNTER — Ambulatory Visit: Admit: 2018-02-13 | Discharge: 2018-02-13 | Payer: MEDICARE

## 2018-02-13 ENCOUNTER — Encounter: Admit: 2018-02-13 | Discharge: 2018-02-13 | Payer: MEDICARE

## 2018-02-13 DIAGNOSIS — F419 Anxiety disorder, unspecified: ICD-10-CM

## 2018-02-13 DIAGNOSIS — E038 Other specified hypothyroidism: Principal | ICD-10-CM

## 2018-02-13 DIAGNOSIS — I10 Essential (primary) hypertension: ICD-10-CM

## 2018-02-13 DIAGNOSIS — E119 Type 2 diabetes mellitus without complications: ICD-10-CM

## 2018-02-13 DIAGNOSIS — M1990 Unspecified osteoarthritis, unspecified site: ICD-10-CM

## 2018-02-13 DIAGNOSIS — M7030 Other bursitis of elbow, unspecified elbow: ICD-10-CM

## 2018-02-13 DIAGNOSIS — E785 Hyperlipidemia, unspecified: ICD-10-CM

## 2018-02-13 DIAGNOSIS — L271 Localized skin eruption due to drugs and medicaments taken internally: ICD-10-CM

## 2018-02-13 DIAGNOSIS — Z9221 Personal history of antineoplastic chemotherapy: ICD-10-CM

## 2018-02-13 DIAGNOSIS — E063 Autoimmune thyroiditis: ICD-10-CM

## 2018-02-13 DIAGNOSIS — E042 Nontoxic multinodular goiter: ICD-10-CM

## 2018-02-13 DIAGNOSIS — C171 Malignant neoplasm of jejunum: Principal | ICD-10-CM

## 2018-02-13 DIAGNOSIS — F329 Major depressive disorder, single episode, unspecified: ICD-10-CM

## 2018-02-13 DIAGNOSIS — C49A3 Gastrointestinal stromal tumor of small intestine: ICD-10-CM

## 2018-02-14 LAB — TSH WITH FREE T4 REFLEX: Lab: 2.6 uU/mL (ref 0.35–5.00)

## 2018-02-21 ENCOUNTER — Encounter: Admit: 2018-02-21 | Discharge: 2018-02-21 | Payer: MEDICARE

## 2018-02-21 DIAGNOSIS — C49A3 Gastrointestinal stromal tumor of small intestine: Principal | ICD-10-CM

## 2018-02-21 MED ORDER — PAZOPANIB 200 MG PO TAB
ORAL_TABLET | Freq: Every day | ORAL | 0 refills | Status: AC
Start: 2018-02-21 — End: 2018-03-08

## 2018-02-22 ENCOUNTER — Encounter: Admit: 2018-02-22 | Discharge: 2018-02-22 | Payer: MEDICARE

## 2018-02-22 DIAGNOSIS — R109 Unspecified abdominal pain: Principal | ICD-10-CM

## 2018-02-23 ENCOUNTER — Encounter: Admit: 2018-02-23 | Discharge: 2018-02-23 | Payer: MEDICARE

## 2018-02-24 ENCOUNTER — Encounter: Admit: 2018-02-24 | Discharge: 2018-02-24 | Payer: MEDICARE

## 2018-03-01 ENCOUNTER — Encounter: Admit: 2018-03-01 | Discharge: 2018-03-01 | Payer: MEDICARE

## 2018-03-01 DIAGNOSIS — E119 Type 2 diabetes mellitus without complications: ICD-10-CM

## 2018-03-01 DIAGNOSIS — E785 Hyperlipidemia, unspecified: ICD-10-CM

## 2018-03-01 DIAGNOSIS — C171 Malignant neoplasm of jejunum: Principal | ICD-10-CM

## 2018-03-01 DIAGNOSIS — M7030 Other bursitis of elbow, unspecified elbow: ICD-10-CM

## 2018-03-01 DIAGNOSIS — M1990 Unspecified osteoarthritis, unspecified site: ICD-10-CM

## 2018-03-01 DIAGNOSIS — C787 Secondary malignant neoplasm of liver and intrahepatic bile duct: Secondary | ICD-10-CM

## 2018-03-01 DIAGNOSIS — F419 Anxiety disorder, unspecified: ICD-10-CM

## 2018-03-01 DIAGNOSIS — Z9221 Personal history of antineoplastic chemotherapy: ICD-10-CM

## 2018-03-01 DIAGNOSIS — I10 Essential (primary) hypertension: ICD-10-CM

## 2018-03-01 DIAGNOSIS — L271 Localized skin eruption due to drugs and medicaments taken internally: ICD-10-CM

## 2018-03-01 DIAGNOSIS — F329 Major depressive disorder, single episode, unspecified: ICD-10-CM

## 2018-03-02 ENCOUNTER — Emergency Department: Admit: 2018-03-02 | Discharge: 2018-03-02 | Payer: MEDICARE

## 2018-03-02 ENCOUNTER — Encounter: Admit: 2018-03-02 | Discharge: 2018-03-02 | Payer: MEDICARE

## 2018-03-02 ENCOUNTER — Inpatient Hospital Stay: Admit: 2018-03-02 | Discharge: 2018-03-08 | Disposition: A | Discharge status: Home / Self Care | Payer: MEDICARE

## 2018-03-02 DIAGNOSIS — C171 Malignant neoplasm of jejunum: Principal | ICD-10-CM

## 2018-03-02 DIAGNOSIS — Z9221 Personal history of antineoplastic chemotherapy: ICD-10-CM

## 2018-03-02 DIAGNOSIS — E119 Type 2 diabetes mellitus without complications: ICD-10-CM

## 2018-03-02 DIAGNOSIS — F329 Major depressive disorder, single episode, unspecified: ICD-10-CM

## 2018-03-02 DIAGNOSIS — I10 Essential (primary) hypertension: ICD-10-CM

## 2018-03-02 DIAGNOSIS — C49A3 Gastrointestinal stromal tumor of small intestine: Secondary | ICD-10-CM

## 2018-03-02 DIAGNOSIS — F419 Anxiety disorder, unspecified: ICD-10-CM

## 2018-03-02 DIAGNOSIS — M1990 Unspecified osteoarthritis, unspecified site: ICD-10-CM

## 2018-03-02 DIAGNOSIS — M7030 Other bursitis of elbow, unspecified elbow: ICD-10-CM

## 2018-03-02 DIAGNOSIS — E785 Hyperlipidemia, unspecified: ICD-10-CM

## 2018-03-02 DIAGNOSIS — L271 Localized skin eruption due to drugs and medicaments taken internally: ICD-10-CM

## 2018-03-02 LAB — COMPREHENSIVE METABOLIC PANEL
Lab: 0.5 mg/dL (ref 0.3–1.2)
Lab: 1 mg/dL (ref 0.4–1.24)
Lab: 102 MMOL/L (ref 98–110)
Lab: 13 mg/dL (ref 7–25)
Lab: 137 MMOL/L — ABNORMAL HIGH (ref 137–147)
Lab: 14 U/L (ref 7–56)
Lab: 161 U/L — ABNORMAL HIGH (ref 25–110)
Lab: 24 U/L (ref 7–40)
Lab: 29 MMOL/L — ABNORMAL HIGH (ref 21–30)
Lab: 3.9 g/dL (ref 3.5–5.0)
Lab: 6 K/UL (ref 3–12)
Lab: 6.9 g/dL (ref 6.0–8.0)
Lab: 89 mg/dL (ref 70–100)
Lab: 9.5 mg/dL (ref 8.5–10.6)
Lab: >60 mL/min (ref >60)
Lab: >60 mL/min (ref >60)

## 2018-03-02 LAB — URINALYSIS, MICROSCOPIC

## 2018-03-02 LAB — POC LACTATE: Lab: 1 MMOL/L (ref 0.5–2.0)

## 2018-03-02 LAB — URINALYSIS DIPSTICK
Lab: NEGATIVE
Lab: NEGATIVE
Lab: NEGATIVE
Lab: NEGATIVE
Lab: NEGATIVE
Lab: POSITIVE — AB

## 2018-03-02 LAB — CBC AND DIFF
Lab: 0.1 10*3/uL (ref 0–0.20)
Lab: 0.7 10*3/uL — ABNORMAL HIGH (ref 0–0.45)
Lab: 7.5 10*3/uL (ref 4.5–11.0)

## 2018-03-02 LAB — PROTIME INR (PT): Lab: 1 (ref 0.8–1.2)

## 2018-03-02 LAB — TSH WITH FREE T4 REFLEX: Lab: 2.7 uU/mL (ref 0.35–5.00)

## 2018-03-02 LAB — PTT (APTT): Lab: 27 s (ref 24.0–36.5)

## 2018-03-02 LAB — LIPASE: Lab: 31 U/L (ref 11–82)

## 2018-03-02 LAB — POC CREATININE, RAD: Lab: 1.1 mg/dL (ref 0.4–1.24)

## 2018-03-02 LAB — AMMONIA: Lab: 87 umol/L — ABNORMAL HIGH (ref 9–35)

## 2018-03-02 MED ORDER — TAMSULOSIN 0.4 MG PO CAP
0.4 mg | Freq: Every day | ORAL | 0 refills | Status: DC
Start: 2018-03-02 — End: 2018-03-08
  Administered 2018-03-03 – 2018-03-08 (×6): 0.4 mg via ORAL

## 2018-03-02 MED ORDER — ENOXAPARIN 40 MG/0.4 ML SC SYRG
40 mg | Freq: Every day | SUBCUTANEOUS | 0 refills | Status: DC
Start: 2018-03-02 — End: 2018-03-08
  Administered 2018-03-03 – 2018-03-08 (×6): 40 mg via SUBCUTANEOUS

## 2018-03-02 MED ORDER — CYANOCOBALAMIN (VITAMIN B-12) 500 MCG PO TAB
500 ug | Freq: Every day | ORAL | 0 refills | Status: DC
Start: 2018-03-02 — End: 2018-03-08
  Administered 2018-03-03 – 2018-03-08 (×6): 500 ug via ORAL

## 2018-03-02 MED ORDER — LACTATED RINGERS IV SOLP
500 mL | Freq: Once | INTRAVENOUS | 0 refills | Status: CP
Start: 2018-03-02 — End: ?
  Administered 2018-03-03: 06:00:00 500 mL via INTRAVENOUS

## 2018-03-02 MED ORDER — ALPRAZOLAM 0.25 MG PO TAB
.25-.5 mg | Freq: Every evening | ORAL | 0 refills | Status: DC | PRN
Start: 2018-03-02 — End: 2018-03-08
  Administered 2018-03-05 – 2018-03-08 (×4): 0.25 mg via ORAL

## 2018-03-02 MED ORDER — FAMOTIDINE 20 MG PO TAB
40 mg | Freq: Two times a day (BID) | ORAL | 0 refills | Status: DC
Start: 2018-03-02 — End: 2018-03-03

## 2018-03-02 MED ORDER — ACETAMINOPHEN 500 MG PO TAB
500 mg | ORAL | 0 refills | Status: DC | PRN
Start: 2018-03-02 — End: 2018-03-08
  Administered 2018-03-03 – 2018-03-08 (×11): 500 mg via ORAL

## 2018-03-02 MED ORDER — ASPIRIN 81 MG PO TBEC
81 mg | Freq: Every day | ORAL | 0 refills | Status: DC
Start: 2018-03-02 — End: 2018-03-08
  Administered 2018-03-03 – 2018-03-08 (×6): 81 mg via ORAL

## 2018-03-02 MED ORDER — SODIUM CHLORIDE 0.9 % IJ SOLN
50 mL | Freq: Once | INTRAVENOUS | 0 refills | Status: CP
Start: 2018-03-02 — End: ?
  Administered 2018-03-03: 01:00:00 50 mL via INTRAVENOUS

## 2018-03-02 MED ORDER — LISINOPRIL 10 MG PO TAB
5 mg | Freq: Every day | ORAL | 0 refills | Status: DC
Start: 2018-03-02 — End: 2018-03-04
  Administered 2018-03-03 – 2018-03-04 (×2): 5 mg via ORAL

## 2018-03-02 MED ORDER — METOPROLOL TARTRATE 25 MG PO TAB
25 mg | Freq: Two times a day (BID) | ORAL | 0 refills | Status: DC
Start: 2018-03-02 — End: 2018-03-06
  Administered 2018-03-03 – 2018-03-06 (×8): 25 mg via ORAL

## 2018-03-02 MED ORDER — VITAMIN E 50 UNIT/ML PO DROP
400 [IU] | Freq: Every day | ORAL | 0 refills | Status: DC
Start: 2018-03-02 — End: 2018-03-08
  Administered 2018-03-03 – 2018-03-08 (×6): 400 [IU] via ORAL

## 2018-03-02 MED ORDER — IOHEXOL 350 MG IODINE/ML IV SOLN
100 mL | Freq: Once | INTRAVENOUS | 0 refills | Status: CP
Start: 2018-03-02 — End: ?
  Administered 2018-03-03: 01:00:00 100 mL via INTRAVENOUS

## 2018-03-02 MED ORDER — LACTULOSE 10 GRAM/15 ML PO SOLN
30 mL | Freq: Once | ORAL | 0 refills | Status: CP
Start: 2018-03-02 — End: ?
  Administered 2018-03-02: 22:00:00 20 g via ORAL

## 2018-03-02 MED ORDER — TRAZODONE 50 MG PO TAB
50 mg | Freq: Every evening | ORAL | 0 refills | Status: DC | PRN
Start: 2018-03-02 — End: 2018-03-08
  Administered 2018-03-03 – 2018-03-06 (×2): 50 mg via ORAL

## 2018-03-02 MED ORDER — MAGNESIUM OXIDE 400 MG (241.3 MG MAGNESIUM) PO TAB
400 mg | Freq: Every day | ORAL | 0 refills | Status: DC
Start: 2018-03-02 — End: 2018-03-08
  Administered 2018-03-03 – 2018-03-08 (×6): 400 mg via ORAL

## 2018-03-02 MED ORDER — LACTULOSE 10 GRAM/15 ML PO SOLN
30 mL | ORAL | 0 refills | Status: DC
Start: 2018-03-02 — End: 2018-03-05
  Administered 2018-03-03 – 2018-03-05 (×13): 20 g via ORAL

## 2018-03-02 MED ORDER — IMS MIXTURE TEMPLATE
400 mg | Freq: Two times a day (BID) | ORAL | 0 refills | Status: DC
Start: 2018-03-02 — End: 2018-03-08
  Administered 2018-03-03 – 2018-03-08 (×22): 400 mg via ORAL

## 2018-03-02 MED ORDER — INSULIN ASPART 100 UNIT/ML SC FLEXPEN
0-6 [IU] | Freq: Before meals | SUBCUTANEOUS | 0 refills | Status: DC
Start: 2018-03-02 — End: 2018-03-08

## 2018-03-02 MED ORDER — LEVOTHYROXINE 112 MCG PO TAB
112 ug | Freq: Every day | ORAL | 0 refills | Status: DC
Start: 2018-03-02 — End: 2018-03-08
  Administered 2018-03-03 – 2018-03-08 (×6): 112 ug via ORAL

## 2018-03-02 MED ORDER — SERTRALINE 100 MG PO TAB
200 mg | Freq: Every day | ORAL | 0 refills | Status: DC
Start: 2018-03-02 — End: 2018-03-08
  Administered 2018-03-03 – 2018-03-08 (×5): 200 mg via ORAL

## 2018-03-03 LAB — CBC AND DIFF: Lab: 9.3 10*3/uL (ref 4.5–11.0)

## 2018-03-03 LAB — COMPREHENSIVE METABOLIC PANEL
Lab: 102 MMOL/L (ref 98–110)
Lab: 138 MMOL/L (ref 137–147)
Lab: 153 U/L — ABNORMAL HIGH (ref 25–110)
Lab: 16 U/L (ref 7–56)
Lab: 26 U/L (ref 7–40)
Lab: 30 MMOL/L — ABNORMAL HIGH (ref 21–30)
Lab: 92 mg/dL (ref 70–100)

## 2018-03-03 LAB — POC GLUCOSE
Lab: 135 mg/dL — ABNORMAL HIGH (ref 70–100)
Lab: 88 mg/dL (ref 70–100)
Lab: 107 mg/dL — ABNORMAL HIGH (ref 70–100)
Lab: 110 mg/dL — ABNORMAL HIGH (ref 70–100)

## 2018-03-03 LAB — BNP (B-TYPE NATRIURETIC PEPTI): Lab: 49 pg/mL (ref 0–100)

## 2018-03-03 LAB — MAGNESIUM: Lab: 2.2 mg/dL (ref 1.6–2.6)

## 2018-03-04 ENCOUNTER — Encounter: Admit: 2018-03-04 | Discharge: 2018-03-04 | Payer: MEDICARE

## 2018-03-04 ENCOUNTER — Inpatient Hospital Stay: Admit: 2018-03-04 | Discharge: 2018-03-04 | Payer: MEDICARE

## 2018-03-04 LAB — POC GLUCOSE
Lab: 189 mg/dL — ABNORMAL HIGH (ref 70–100)
Lab: 112 mg/dL — ABNORMAL HIGH (ref 70–100)
Lab: 110 mg/dL — ABNORMAL HIGH (ref >60)
Lab: 169 mg/dL — ABNORMAL HIGH (ref 70–100)

## 2018-03-04 LAB — COMPREHENSIVE METABOLIC PANEL
Lab: 102 MMOL/L — ABNORMAL LOW (ref >60)
Lab: 139 MMOL/L — ABNORMAL HIGH (ref >60)
Lab: 3.8 MMOL/L — ABNORMAL HIGH (ref 3.5–5.1)

## 2018-03-04 LAB — CBC AND DIFF
Lab: 5 M/UL — ABNORMAL HIGH (ref >60)
Lab: 8.1 K/UL — ABNORMAL HIGH (ref 4.5–11.0)

## 2018-03-04 MED ORDER — LISINOPRIL 10 MG PO TAB
10 mg | Freq: Every day | ORAL | 0 refills | Status: DC
Start: 2018-03-04 — End: 2018-03-08
  Administered 2018-03-05 – 2018-03-08 (×4): 10 mg via ORAL

## 2018-03-04 MED ORDER — GADOBENATE DIMEGLUMINE 529 MG/ML (0.1MMOL/0.2ML) IV SOLN
20 mL | Freq: Once | INTRAVENOUS | 0 refills | Status: CP
Start: 2018-03-04 — End: ?
  Administered 2018-03-04: 20:00:00 20 mL via INTRAVENOUS

## 2018-03-04 MED ORDER — HELP MEDICATION
Freq: Every day | 0 refills | Status: DC
Start: 2018-03-04 — End: 2018-03-04

## 2018-03-04 MED ORDER — PAZOPANIB 200 MG PO TAB
200 mg | Freq: Every day | ORAL | 0 refills | Status: DC
Start: 2018-03-04 — End: 2018-03-06

## 2018-03-05 LAB — POC GLUCOSE
Lab: 132 mg/dL — ABNORMAL HIGH (ref 70–100)
Lab: 104 mg/dL — ABNORMAL HIGH (ref >60)
Lab: 119 mg/dL — ABNORMAL HIGH (ref 70–100)
Lab: 162 mg/dL — ABNORMAL HIGH (ref 70–100)

## 2018-03-05 LAB — COMPREHENSIVE METABOLIC PANEL
Lab: 103 mg/dL — ABNORMAL HIGH (ref 70–100)
Lab: 140 MMOL/L — ABNORMAL HIGH (ref 137–147)
Lab: 3.8 MMOL/L — ABNORMAL HIGH (ref 3.5–5.1)

## 2018-03-05 LAB — CBC AND DIFF: Lab: 9.5 10*3/uL — ABNORMAL LOW (ref 4.5–11.0)

## 2018-03-05 MED ORDER — POLYETHYLENE GLYCOL 3350 17 GRAM PO PWPK
1 | Freq: Two times a day (BID) | ORAL | 0 refills | Status: DC | PRN
Start: 2018-03-05 — End: 2018-03-08
  Administered 2018-03-08: 13:00:00 17 g via ORAL

## 2018-03-05 MED ORDER — FENTANYL CITRATE (PF) 50 MCG/ML IJ SOLN
25 ug | INTRAVENOUS | 0 refills | Status: DC | PRN
Start: 2018-03-05 — End: 2018-03-08

## 2018-03-05 MED ORDER — CELECOXIB 200 MG PO CAP
200 mg | Freq: Every day | ORAL | 0 refills | Status: DC | PRN
Start: 2018-03-05 — End: 2018-03-08

## 2018-03-05 MED ORDER — ONDANSETRON HCL 4 MG PO TAB
8 mg | ORAL | 0 refills | Status: DC | PRN
Start: 2018-03-05 — End: 2018-03-08
  Administered 2018-03-05 – 2018-03-07 (×4): 8 mg via ORAL

## 2018-03-05 MED ORDER — HYDRALAZINE 20 MG/ML IJ SOLN
5-10 mg | INTRAVENOUS | 0 refills | Status: DC | PRN
Start: 2018-03-05 — End: 2018-03-08
  Administered 2018-03-05: 21:00:00 10 mg via INTRAVENOUS

## 2018-03-05 MED ORDER — OXYCODONE 5 MG PO TAB
2.5-5 mg | ORAL | 0 refills | Status: DC | PRN
Start: 2018-03-05 — End: 2018-03-08
  Administered 2018-03-05: 21:00:00 2.5 mg via ORAL
  Administered 2018-03-06: 20:00:00 5 mg via ORAL
  Administered 2018-03-07: 19:00:00 2.5 mg via ORAL
  Administered 2018-03-07: 14:00:00 5 mg via ORAL

## 2018-03-05 MED ORDER — LACTULOSE 10 GRAM/15 ML PO SOLN
30 mL | Freq: Two times a day (BID) | ORAL | 0 refills | Status: DC
Start: 2018-03-05 — End: 2018-03-07
  Administered 2018-03-06 – 2018-03-07 (×4): 20 g via ORAL

## 2018-03-05 MED ORDER — HYDRALAZINE 20 MG/ML IJ SOLN
5 mg | Freq: Once | INTRAVENOUS | 0 refills | Status: CP
Start: 2018-03-05 — End: ?
  Administered 2018-03-05: 22:00:00 5 mg via INTRAVENOUS

## 2018-03-05 MED ORDER — AMLODIPINE 10 MG PO TAB
10 mg | Freq: Every day | ORAL | 0 refills | Status: DC
Start: 2018-03-05 — End: 2018-03-08
  Administered 2018-03-05 – 2018-03-08 (×4): 10 mg via ORAL

## 2018-03-06 LAB — POC GLUCOSE
Lab: 139 mg/dL — ABNORMAL HIGH (ref 70–100)
Lab: 103 mg/dL — ABNORMAL HIGH (ref 70–100)
Lab: 108 mg/dL — ABNORMAL HIGH (ref 70–100)
Lab: 117 mg/dL — ABNORMAL HIGH (ref 70–100)

## 2018-03-06 LAB — COMPREHENSIVE METABOLIC PANEL: Lab: 138 MMOL/L — ABNORMAL LOW (ref >60)

## 2018-03-06 LAB — CBC AND DIFF: Lab: 9.3 K/UL — ABNORMAL LOW (ref 4.5–11.0)

## 2018-03-06 MED ORDER — POTASSIUM CHLORIDE 20 MEQ PO TBTQ
60 meq | Freq: Once | ORAL | 0 refills | Status: CP
Start: 2018-03-06 — End: ?
  Administered 2018-03-06: 18:00:00 60 meq via ORAL

## 2018-03-06 MED ORDER — METOPROLOL TARTRATE 50 MG PO TAB
50 mg | Freq: Two times a day (BID) | ORAL | 0 refills | Status: DC
Start: 2018-03-06 — End: 2018-03-07
  Administered 2018-03-07 (×2): 50 mg via ORAL

## 2018-03-06 MED ORDER — METOPROLOL TARTRATE 25 MG PO TAB
25 mg | Freq: Once | ORAL | 0 refills | Status: CP
Start: 2018-03-06 — End: ?
  Administered 2018-03-06: 15:00:00 25 mg via ORAL

## 2018-03-07 ENCOUNTER — Encounter: Admit: 2018-03-07 | Discharge: 2018-03-07 | Payer: MEDICARE

## 2018-03-07 ENCOUNTER — Inpatient Hospital Stay: Admit: 2018-03-07 | Discharge: 2018-03-07 | Payer: MEDICARE

## 2018-03-07 LAB — POC GLUCOSE
Lab: 123 mg/dL — ABNORMAL HIGH (ref 70–100)
Lab: 118 mg/dL — ABNORMAL HIGH (ref 70–100)
Lab: 114 mg/dL — ABNORMAL HIGH (ref 70–100)
Lab: 118 mg/dL — ABNORMAL HIGH (ref 70–100)

## 2018-03-07 LAB — COMPREHENSIVE METABOLIC PANEL: Lab: 137 MMOL/L — ABNORMAL HIGH (ref 137–147)

## 2018-03-07 LAB — CBC AND DIFF: Lab: 9.9 K/UL — ABNORMAL HIGH (ref 4.5–11.0)

## 2018-03-07 LAB — HEMOGLOBIN A1C: Lab: 6 % — ABNORMAL HIGH (ref >60)

## 2018-03-07 MED ORDER — PANTOPRAZOLE 40 MG PO TBEC
40 mg | Freq: Every day | ORAL | 0 refills | Status: DC
Start: 2018-03-07 — End: 2018-03-08
  Administered 2018-03-07: 17:00:00 40 mg via ORAL

## 2018-03-07 MED ORDER — SENNOSIDES-DOCUSATE SODIUM 8.6-50 MG PO TAB
2 | Freq: Two times a day (BID) | ORAL | 0 refills | Status: DC
Start: 2018-03-07 — End: 2018-03-08
  Administered 2018-03-07 – 2018-03-08 (×3): 2 via ORAL

## 2018-03-07 MED ORDER — METOPROLOL TARTRATE 25 MG PO TAB
25 mg | Freq: Once | ORAL | 0 refills | Status: CP
Start: 2018-03-07 — End: ?
  Administered 2018-03-07: 17:00:00 25 mg via ORAL

## 2018-03-07 MED ORDER — IMS MIXTURE TEMPLATE
75 mg | Freq: Two times a day (BID) | ORAL | 0 refills | Status: DC
Start: 2018-03-07 — End: 2018-03-08
  Administered 2018-03-08 (×3): 75 mg via ORAL

## 2018-03-07 MED ORDER — LACTULOSE 10 GRAM/15 ML PO SOLN
30 mL | Freq: Three times a day (TID) | ORAL | 0 refills | Status: DC
Start: 2018-03-07 — End: 2018-03-08
  Administered 2018-03-07 – 2018-03-08 (×3): 20 g via ORAL

## 2018-03-08 ENCOUNTER — Emergency Department: Admit: 2018-03-02 | Discharge: 2018-03-02 | Payer: MEDICARE

## 2018-03-08 ENCOUNTER — Encounter: Admit: 2018-03-01 | Discharge: 2018-03-01 | Payer: MEDICARE

## 2018-03-08 DIAGNOSIS — G9340 Encephalopathy, unspecified: Principal | ICD-10-CM

## 2018-03-08 DIAGNOSIS — R441 Visual hallucinations: ICD-10-CM

## 2018-03-08 DIAGNOSIS — E119 Type 2 diabetes mellitus without complications: ICD-10-CM

## 2018-03-08 DIAGNOSIS — F419 Anxiety disorder, unspecified: ICD-10-CM

## 2018-03-08 DIAGNOSIS — C787 Secondary malignant neoplasm of liver and intrahepatic bile duct: ICD-10-CM

## 2018-03-08 DIAGNOSIS — M549 Dorsalgia, unspecified: ICD-10-CM

## 2018-03-08 DIAGNOSIS — E039 Hypothyroidism, unspecified: ICD-10-CM

## 2018-03-08 DIAGNOSIS — G4733 Obstructive sleep apnea (adult) (pediatric): ICD-10-CM

## 2018-03-08 DIAGNOSIS — Z66 Do not resuscitate: ICD-10-CM

## 2018-03-08 DIAGNOSIS — I10 Essential (primary) hypertension: ICD-10-CM

## 2018-03-08 DIAGNOSIS — F329 Major depressive disorder, single episode, unspecified: ICD-10-CM

## 2018-03-08 DIAGNOSIS — Z515 Encounter for palliative care: ICD-10-CM

## 2018-03-08 DIAGNOSIS — E785 Hyperlipidemia, unspecified: ICD-10-CM

## 2018-03-08 DIAGNOSIS — C7931 Secondary malignant neoplasm of brain: ICD-10-CM

## 2018-03-08 DIAGNOSIS — C49A3 Gastrointestinal stromal tumor of small intestine: ICD-10-CM

## 2018-03-08 DIAGNOSIS — Z85068 Personal history of other malignant neoplasm of small intestine: ICD-10-CM

## 2018-03-08 DIAGNOSIS — R440 Auditory hallucinations: ICD-10-CM

## 2018-03-08 DIAGNOSIS — Z9049 Acquired absence of other specified parts of digestive tract: ICD-10-CM

## 2018-03-08 DIAGNOSIS — G8929 Other chronic pain: ICD-10-CM

## 2018-03-08 DIAGNOSIS — Z9013 Acquired absence of bilateral breasts and nipples: ICD-10-CM

## 2018-03-08 LAB — COMPREHENSIVE METABOLIC PANEL: Lab: 140 MMOL/L — ABNORMAL LOW (ref 137–147)

## 2018-03-08 LAB — POC GLUCOSE
Lab: 114 mg/dL — ABNORMAL HIGH (ref 70–100)
Lab: 102 mg/dL — ABNORMAL HIGH (ref 70–100)

## 2018-03-08 LAB — CBC AND DIFF: Lab: 8.2 K/UL — ABNORMAL HIGH (ref 4.5–11.0)

## 2018-03-08 MED ORDER — LACTULOSE 10 GRAM/15 ML PO SOLN
20 g | Freq: Three times a day (TID) | ORAL | 1 refills | 21.00000 days | Status: AC
Start: 2018-03-08 — End: 2018-05-11

## 2018-03-08 MED ORDER — AMLODIPINE 10 MG PO TAB
10 mg | ORAL_TABLET | Freq: Every day | ORAL | 3 refills | Status: AC
Start: 2018-03-08 — End: ?

## 2018-03-08 MED ORDER — METOPROLOL TARTRATE 75 MG PO TAB
75 mg | ORAL_TABLET | Freq: Two times a day (BID) | ORAL | 3 refills | 90.00000 days | Status: AC
Start: 2018-03-08 — End: ?

## 2018-03-08 MED ORDER — HEPARIN, PORCINE (PF) 100 UNIT/ML IV SYRG
500 [IU] | Freq: Once | INTRAVENOUS | 0 refills | Status: CP
Start: 2018-03-08 — End: ?
  Administered 2018-03-08: 16:00:00 500 [IU] via INTRAVENOUS

## 2018-03-08 MED ORDER — SENNOSIDES-DOCUSATE SODIUM 8.6-50 MG PO TAB
2 | ORAL_TABLET | Freq: Two times a day (BID) | ORAL | 1 refills | Status: AC
Start: 2018-03-08 — End: ?

## 2018-03-08 MED ORDER — CELECOXIB 200 MG PO CAP
200 mg | ORAL_CAPSULE | Freq: Every day | ORAL | 1 refills | Status: AC | PRN
Start: 2018-03-08 — End: ?

## 2018-03-08 MED ORDER — POLYETHYLENE GLYCOL 3350 17 GRAM PO PWPK
17 g | Freq: Two times a day (BID) | ORAL | 0 refills | 22.00000 days | Status: AC | PRN
Start: 2018-03-08 — End: ?

## 2018-03-08 MED ORDER — PANTOPRAZOLE 40 MG PO TBEC
40 mg | ORAL_TABLET | Freq: Every day | ORAL | 1 refills | 90.00000 days | Status: AC
Start: 2018-03-08 — End: ?

## 2018-03-08 MED ORDER — OXYCODONE 5 MG PO TAB
2.5-5 mg | ORAL_TABLET | ORAL | 0 refills | 6.00000 days | Status: AC | PRN
Start: 2018-03-08 — End: ?

## 2018-03-15 ENCOUNTER — Encounter: Admit: 2018-03-15 | Discharge: 2018-03-15 | Payer: MEDICARE

## 2018-03-15 ENCOUNTER — Encounter: Admit: 2018-03-15 | Discharge: 2018-03-16 | Payer: MEDICARE

## 2018-03-15 DIAGNOSIS — L271 Localized skin eruption due to drugs and medicaments taken internally: ICD-10-CM

## 2018-03-15 DIAGNOSIS — M1990 Unspecified osteoarthritis, unspecified site: ICD-10-CM

## 2018-03-15 DIAGNOSIS — E039 Hypothyroidism, unspecified: ICD-10-CM

## 2018-03-15 DIAGNOSIS — I10 Essential (primary) hypertension: ICD-10-CM

## 2018-03-15 DIAGNOSIS — C787 Secondary malignant neoplasm of liver and intrahepatic bile duct: ICD-10-CM

## 2018-03-15 DIAGNOSIS — C49A3 Gastrointestinal stromal tumor of small intestine: Principal | ICD-10-CM

## 2018-03-15 DIAGNOSIS — Z9221 Personal history of antineoplastic chemotherapy: ICD-10-CM

## 2018-03-15 DIAGNOSIS — F419 Anxiety disorder, unspecified: ICD-10-CM

## 2018-03-15 DIAGNOSIS — F329 Major depressive disorder, single episode, unspecified: ICD-10-CM

## 2018-03-15 DIAGNOSIS — C171 Malignant neoplasm of jejunum: ICD-10-CM

## 2018-03-15 DIAGNOSIS — E119 Type 2 diabetes mellitus without complications: ICD-10-CM

## 2018-03-15 DIAGNOSIS — C7931 Secondary malignant neoplasm of brain: ICD-10-CM

## 2018-03-15 DIAGNOSIS — E785 Hyperlipidemia, unspecified: ICD-10-CM

## 2018-03-15 DIAGNOSIS — M7030 Other bursitis of elbow, unspecified elbow: ICD-10-CM

## 2018-03-15 LAB — COMPREHENSIVE METABOLIC PANEL
Lab: 0.7 mg/dL (ref 0.3–1.2)
Lab: 1.1 mg/dL (ref 0.4–1.24)
Lab: 100 MMOL/L (ref 98–110)
Lab: 127 mg/dL — ABNORMAL HIGH (ref 70–100)
Lab: 137 MMOL/L (ref 137–147)
Lab: 189 U/L — ABNORMAL HIGH (ref 25–110)
Lab: 29 MMOL/L (ref 21–30)
Lab: 4 MMOL/L (ref 3.5–5.1)
Lab: 8 10*3/uL (ref 3–12)
Lab: 9 mg/dL (ref 7–25)
Lab: 9.9 mg/dL (ref 8.5–10.6)
Lab: >60 mL/min — ABNORMAL HIGH (ref >60)

## 2018-03-15 LAB — CBC AND DIFF
Lab: 0.1 10*3/uL (ref 0–0.20)
Lab: 5.2 M/UL (ref 4.4–5.5)
Lab: 7.9 10*3/uL (ref 4.5–11.0)

## 2018-03-15 LAB — FREE T4-FREE THYROXINE: Lab: 0.9 ng/dL (ref 0.6–1.6)

## 2018-03-15 LAB — TSH WITH FREE T4 REFLEX: Lab: 8.5 uU/mL — ABNORMAL HIGH (ref 0.35–5.00)

## 2018-03-15 MED ORDER — HEPARIN, PORCINE (PF) 100 UNIT/ML IV SYRG
500 [IU] | Freq: Once | 0 refills | Status: CP
Start: 2018-03-15 — End: ?

## 2018-03-16 ENCOUNTER — Encounter: Admit: 2018-03-16 | Discharge: 2018-03-16 | Payer: MEDICARE

## 2018-03-16 DIAGNOSIS — Z9221 Personal history of antineoplastic chemotherapy: ICD-10-CM

## 2018-03-16 DIAGNOSIS — F329 Major depressive disorder, single episode, unspecified: ICD-10-CM

## 2018-03-16 DIAGNOSIS — C49A3 Gastrointestinal stromal tumor of small intestine: Principal | ICD-10-CM

## 2018-03-16 DIAGNOSIS — C7931 Secondary malignant neoplasm of brain: ICD-10-CM

## 2018-03-16 DIAGNOSIS — L271 Localized skin eruption due to drugs and medicaments taken internally: ICD-10-CM

## 2018-03-16 DIAGNOSIS — E785 Hyperlipidemia, unspecified: ICD-10-CM

## 2018-03-16 DIAGNOSIS — F4323 Adjustment disorder with mixed anxiety and depressed mood: ICD-10-CM

## 2018-03-16 DIAGNOSIS — I10 Essential (primary) hypertension: ICD-10-CM

## 2018-03-16 DIAGNOSIS — C787 Secondary malignant neoplasm of liver and intrahepatic bile duct: ICD-10-CM

## 2018-03-16 DIAGNOSIS — F419 Anxiety disorder, unspecified: ICD-10-CM

## 2018-03-16 DIAGNOSIS — M1990 Unspecified osteoarthritis, unspecified site: ICD-10-CM

## 2018-03-16 DIAGNOSIS — E119 Type 2 diabetes mellitus without complications: ICD-10-CM

## 2018-03-16 DIAGNOSIS — C171 Malignant neoplasm of jejunum: Principal | ICD-10-CM

## 2018-03-16 DIAGNOSIS — M7030 Other bursitis of elbow, unspecified elbow: ICD-10-CM

## 2018-03-22 ENCOUNTER — Ambulatory Visit: Admit: 2018-03-22 | Discharge: 2018-03-22 | Payer: MEDICARE

## 2018-03-22 DIAGNOSIS — F411 Generalized anxiety disorder: Principal | ICD-10-CM

## 2018-03-22 DIAGNOSIS — F4323 Adjustment disorder with mixed anxiety and depressed mood: ICD-10-CM

## 2018-03-22 MED ORDER — LORAZEPAM 1 MG PO TAB
.5-1 mg | ORAL_TABLET | Freq: Every evening | ORAL | 2 refills | 12.00000 days | Status: AC | PRN
Start: 2018-03-22 — End: ?

## 2018-03-23 ENCOUNTER — Encounter: Admit: 2018-03-23 | Discharge: 2018-03-23 | Payer: MEDICARE

## 2018-03-23 DIAGNOSIS — E785 Hyperlipidemia, unspecified: ICD-10-CM

## 2018-03-23 DIAGNOSIS — I10 Essential (primary) hypertension: ICD-10-CM

## 2018-03-23 DIAGNOSIS — L271 Localized skin eruption due to drugs and medicaments taken internally: ICD-10-CM

## 2018-03-23 DIAGNOSIS — E119 Type 2 diabetes mellitus without complications: ICD-10-CM

## 2018-03-23 DIAGNOSIS — C49A3 Gastrointestinal stromal tumor of small intestine: Principal | ICD-10-CM

## 2018-03-23 DIAGNOSIS — G893 Neoplasm related pain (acute) (chronic): ICD-10-CM

## 2018-03-23 DIAGNOSIS — Z9221 Personal history of antineoplastic chemotherapy: ICD-10-CM

## 2018-03-23 DIAGNOSIS — F329 Major depressive disorder, single episode, unspecified: ICD-10-CM

## 2018-03-23 DIAGNOSIS — Z87891 Personal history of nicotine dependence: ICD-10-CM

## 2018-03-23 DIAGNOSIS — F419 Anxiety disorder, unspecified: ICD-10-CM

## 2018-03-23 DIAGNOSIS — M7030 Other bursitis of elbow, unspecified elbow: ICD-10-CM

## 2018-03-23 DIAGNOSIS — M1990 Unspecified osteoarthritis, unspecified site: ICD-10-CM

## 2018-03-23 DIAGNOSIS — C7931 Secondary malignant neoplasm of brain: ICD-10-CM

## 2018-03-23 DIAGNOSIS — C171 Malignant neoplasm of jejunum: ICD-10-CM

## 2018-03-23 DIAGNOSIS — Z7982 Long term (current) use of aspirin: ICD-10-CM

## 2018-03-27 ENCOUNTER — Encounter: Admit: 2018-03-27 | Discharge: 2018-03-27 | Payer: MEDICARE

## 2018-03-27 DIAGNOSIS — F4323 Adjustment disorder with mixed anxiety and depressed mood: Principal | ICD-10-CM

## 2018-04-05 ENCOUNTER — Ambulatory Visit: Admit: 2018-04-05 | Discharge: 2018-04-06 | Payer: MEDICARE

## 2018-04-05 ENCOUNTER — Encounter: Admit: 2018-04-05 | Discharge: 2018-04-05 | Payer: MEDICARE

## 2018-04-05 DIAGNOSIS — M7030 Other bursitis of elbow, unspecified elbow: ICD-10-CM

## 2018-04-05 DIAGNOSIS — E785 Hyperlipidemia, unspecified: ICD-10-CM

## 2018-04-05 DIAGNOSIS — F329 Major depressive disorder, single episode, unspecified: ICD-10-CM

## 2018-04-05 DIAGNOSIS — M5416 Radiculopathy, lumbar region: Principal | ICD-10-CM

## 2018-04-05 DIAGNOSIS — M5116 Intervertebral disc disorders with radiculopathy, lumbar region: Principal | ICD-10-CM

## 2018-04-05 DIAGNOSIS — C171 Malignant neoplasm of jejunum: Principal | ICD-10-CM

## 2018-04-05 DIAGNOSIS — Z9221 Personal history of antineoplastic chemotherapy: ICD-10-CM

## 2018-04-05 DIAGNOSIS — M1990 Unspecified osteoarthritis, unspecified site: ICD-10-CM

## 2018-04-05 DIAGNOSIS — I10 Essential (primary) hypertension: ICD-10-CM

## 2018-04-05 DIAGNOSIS — E119 Type 2 diabetes mellitus without complications: ICD-10-CM

## 2018-04-05 DIAGNOSIS — F419 Anxiety disorder, unspecified: ICD-10-CM

## 2018-04-05 DIAGNOSIS — L271 Localized skin eruption due to drugs and medicaments taken internally: ICD-10-CM

## 2018-04-05 MED ORDER — IOPAMIDOL 41 % IT SOLN
2.5 mL | Freq: Once | EPIDURAL | 0 refills | Status: CP
Start: 2018-04-05 — End: ?
  Administered 2018-04-05: 15:00:00 2.5 mL via EPIDURAL

## 2018-04-05 MED ORDER — TRIAMCINOLONE ACETONIDE 40 MG/ML IJ SUSP
80 mg | Freq: Once | EPIDURAL | 0 refills | Status: CP
Start: 2018-04-05 — End: ?
  Administered 2018-04-05: 15:00:00 80 mg via EPIDURAL

## 2018-04-06 DIAGNOSIS — Z85068 Personal history of other malignant neoplasm of small intestine: ICD-10-CM

## 2018-04-06 DIAGNOSIS — E119 Type 2 diabetes mellitus without complications: ICD-10-CM

## 2018-04-06 DIAGNOSIS — Z87891 Personal history of nicotine dependence: ICD-10-CM

## 2018-04-06 DIAGNOSIS — E785 Hyperlipidemia, unspecified: ICD-10-CM

## 2018-04-06 DIAGNOSIS — I10 Essential (primary) hypertension: ICD-10-CM

## 2018-04-13 ENCOUNTER — Encounter: Admit: 2018-04-13 | Discharge: 2018-04-13 | Payer: MEDICARE

## 2018-04-13 DIAGNOSIS — C7931 Secondary malignant neoplasm of brain: ICD-10-CM

## 2018-04-13 DIAGNOSIS — F329 Major depressive disorder, single episode, unspecified: ICD-10-CM

## 2018-04-13 DIAGNOSIS — F4323 Adjustment disorder with mixed anxiety and depressed mood: ICD-10-CM

## 2018-04-13 DIAGNOSIS — K5900 Constipation, unspecified: ICD-10-CM

## 2018-04-13 DIAGNOSIS — M1990 Unspecified osteoarthritis, unspecified site: ICD-10-CM

## 2018-04-13 DIAGNOSIS — I10 Essential (primary) hypertension: ICD-10-CM

## 2018-04-13 DIAGNOSIS — E119 Type 2 diabetes mellitus without complications: ICD-10-CM

## 2018-04-13 DIAGNOSIS — E785 Hyperlipidemia, unspecified: ICD-10-CM

## 2018-04-13 DIAGNOSIS — C49A3 Gastrointestinal stromal tumor of small intestine: Principal | ICD-10-CM

## 2018-04-13 DIAGNOSIS — F419 Anxiety disorder, unspecified: ICD-10-CM

## 2018-04-13 DIAGNOSIS — M7030 Other bursitis of elbow, unspecified elbow: ICD-10-CM

## 2018-04-13 DIAGNOSIS — Z9221 Personal history of antineoplastic chemotherapy: ICD-10-CM

## 2018-04-13 DIAGNOSIS — C171 Malignant neoplasm of jejunum: ICD-10-CM

## 2018-04-13 DIAGNOSIS — G893 Neoplasm related pain (acute) (chronic): ICD-10-CM

## 2018-04-13 DIAGNOSIS — L271 Localized skin eruption due to drugs and medicaments taken internally: ICD-10-CM

## 2018-04-13 DIAGNOSIS — Z7982 Long term (current) use of aspirin: ICD-10-CM

## 2018-04-13 DIAGNOSIS — G8929 Other chronic pain: ICD-10-CM

## 2018-04-13 DIAGNOSIS — Z79899 Other long term (current) drug therapy: ICD-10-CM

## 2018-04-13 MED ORDER — DEXAMETHASONE 2 MG PO TAB
2 mg | ORAL_TABLET | Freq: Every day | ORAL | 0 refills | 12.50000 days | Status: AC
Start: 2018-04-13 — End: 2018-05-11

## 2018-04-18 ENCOUNTER — Encounter: Admit: 2018-04-18 | Discharge: 2018-04-18 | Payer: MEDICARE

## 2018-04-20 ENCOUNTER — Encounter: Admit: 2018-04-20 | Discharge: 2018-04-20 | Payer: MEDICARE

## 2018-04-20 DIAGNOSIS — I10 Essential (primary) hypertension: ICD-10-CM

## 2018-04-20 DIAGNOSIS — M1990 Unspecified osteoarthritis, unspecified site: ICD-10-CM

## 2018-04-20 DIAGNOSIS — L271 Localized skin eruption due to drugs and medicaments taken internally: ICD-10-CM

## 2018-04-20 DIAGNOSIS — F419 Anxiety disorder, unspecified: ICD-10-CM

## 2018-04-20 DIAGNOSIS — E785 Hyperlipidemia, unspecified: ICD-10-CM

## 2018-04-20 DIAGNOSIS — C7931 Secondary malignant neoplasm of brain: ICD-10-CM

## 2018-04-20 DIAGNOSIS — F329 Major depressive disorder, single episode, unspecified: ICD-10-CM

## 2018-04-20 DIAGNOSIS — E119 Type 2 diabetes mellitus without complications: ICD-10-CM

## 2018-04-20 DIAGNOSIS — C49A3 Gastrointestinal stromal tumor of small intestine: Principal | ICD-10-CM

## 2018-04-20 DIAGNOSIS — Z9221 Personal history of antineoplastic chemotherapy: ICD-10-CM

## 2018-04-20 DIAGNOSIS — M7030 Other bursitis of elbow, unspecified elbow: ICD-10-CM

## 2018-04-20 DIAGNOSIS — C171 Malignant neoplasm of jejunum: ICD-10-CM

## 2018-04-20 DIAGNOSIS — C787 Secondary malignant neoplasm of liver and intrahepatic bile duct: ICD-10-CM

## 2018-04-20 MED ORDER — DEXAMETHASONE 4 MG PO TAB
4 mg | ORAL_TABLET | Freq: Every day | ORAL | 5 refills | 12.50000 days | Status: AC
Start: 2018-04-20 — End: ?

## 2018-04-20 MED ORDER — CYCLOBENZAPRINE 10 MG PO TAB
10 mg | ORAL_TABLET | Freq: Three times a day (TID) | ORAL | 3 refills | 21.00000 days | Status: AC | PRN
Start: 2018-04-20 — End: ?

## 2018-05-11 ENCOUNTER — Encounter: Admit: 2018-05-11 | Discharge: 2018-05-11 | Payer: MEDICARE

## 2018-05-11 DIAGNOSIS — L271 Localized skin eruption due to drugs and medicaments taken internally: ICD-10-CM

## 2018-05-11 DIAGNOSIS — F4323 Adjustment disorder with mixed anxiety and depressed mood: Principal | ICD-10-CM

## 2018-05-11 DIAGNOSIS — C171 Malignant neoplasm of jejunum: ICD-10-CM

## 2018-05-11 DIAGNOSIS — F419 Anxiety disorder, unspecified: ICD-10-CM

## 2018-05-11 DIAGNOSIS — K5901 Slow transit constipation: ICD-10-CM

## 2018-05-11 DIAGNOSIS — E119 Type 2 diabetes mellitus without complications: ICD-10-CM

## 2018-05-11 DIAGNOSIS — K7290 Hepatic failure, unspecified without coma: ICD-10-CM

## 2018-05-11 DIAGNOSIS — F329 Major depressive disorder, single episode, unspecified: ICD-10-CM

## 2018-05-11 DIAGNOSIS — G893 Neoplasm related pain (acute) (chronic): ICD-10-CM

## 2018-05-11 DIAGNOSIS — Z9221 Personal history of antineoplastic chemotherapy: ICD-10-CM

## 2018-05-11 DIAGNOSIS — R530 Neoplastic (malignant) related fatigue: ICD-10-CM

## 2018-05-11 DIAGNOSIS — M1990 Unspecified osteoarthritis, unspecified site: ICD-10-CM

## 2018-05-11 DIAGNOSIS — M7030 Other bursitis of elbow, unspecified elbow: ICD-10-CM

## 2018-05-11 DIAGNOSIS — I10 Essential (primary) hypertension: ICD-10-CM

## 2018-05-11 DIAGNOSIS — E785 Hyperlipidemia, unspecified: ICD-10-CM

## 2018-05-11 MED ORDER — LACTULOSE 10 GRAM/15 ML PO SOLN
20 g | Freq: Three times a day (TID) | ORAL | 1 refills | 21.00000 days | Status: AC
Start: 2018-05-11 — End: ?

## 2018-05-12 ENCOUNTER — Encounter: Admit: 2018-05-12 | Discharge: 2018-05-12 | Payer: MEDICARE

## 2018-05-12 DIAGNOSIS — F4323 Adjustment disorder with mixed anxiety and depressed mood: Principal | ICD-10-CM

## 2018-05-16 MED ORDER — GABAPENTIN 400 MG PO CAP
ORAL_CAPSULE | Freq: Two times a day (BID) | 1 refills | Status: AC
Start: 2018-05-16 — End: ?

## 2018-05-18 ENCOUNTER — Encounter: Admit: 2018-05-18 | Discharge: 2018-05-18 | Payer: MEDICARE

## 2018-05-18 ENCOUNTER — Encounter: Admit: 2018-05-18 | Discharge: 2018-05-19 | Payer: MEDICARE

## 2018-05-18 DIAGNOSIS — C7931 Secondary malignant neoplasm of brain: ICD-10-CM

## 2018-05-18 DIAGNOSIS — C787 Secondary malignant neoplasm of liver and intrahepatic bile duct: ICD-10-CM

## 2018-05-18 DIAGNOSIS — Z9221 Personal history of antineoplastic chemotherapy: ICD-10-CM

## 2018-05-18 DIAGNOSIS — E119 Type 2 diabetes mellitus without complications: ICD-10-CM

## 2018-05-18 DIAGNOSIS — I10 Essential (primary) hypertension: ICD-10-CM

## 2018-05-18 DIAGNOSIS — E785 Hyperlipidemia, unspecified: ICD-10-CM

## 2018-05-18 DIAGNOSIS — F329 Major depressive disorder, single episode, unspecified: ICD-10-CM

## 2018-05-18 DIAGNOSIS — C171 Malignant neoplasm of jejunum: ICD-10-CM

## 2018-05-18 DIAGNOSIS — C49A3 Gastrointestinal stromal tumor of small intestine: Principal | ICD-10-CM

## 2018-05-18 DIAGNOSIS — F419 Anxiety disorder, unspecified: ICD-10-CM

## 2018-05-18 DIAGNOSIS — M7030 Other bursitis of elbow, unspecified elbow: ICD-10-CM

## 2018-05-18 DIAGNOSIS — M1990 Unspecified osteoarthritis, unspecified site: ICD-10-CM

## 2018-05-18 DIAGNOSIS — L271 Localized skin eruption due to drugs and medicaments taken internally: ICD-10-CM

## 2018-05-18 MED ORDER — HEPARIN, PORCINE (PF) 100 UNIT/ML IV SYRG
500 [IU] | Freq: Once | 0 refills | Status: CP
Start: 2018-05-18 — End: ?

## 2018-05-19 ENCOUNTER — Encounter: Admit: 2018-05-19 | Discharge: 2018-05-19 | Payer: MEDICARE

## 2018-05-19 MED ORDER — TRAMADOL 50 MG PO TAB
ORAL_TABLET | Freq: Four times a day (QID) | 1 refills | PRN
Start: 2018-05-19 — End: ?

## 2018-05-25 ENCOUNTER — Encounter: Admit: 2018-05-25 | Discharge: 2018-05-25 | Payer: MEDICARE

## 2018-05-25 DIAGNOSIS — F4323 Adjustment disorder with mixed anxiety and depressed mood: Principal | ICD-10-CM

## 2018-06-07 ENCOUNTER — Ambulatory Visit: Admit: 2018-06-07 | Discharge: 2018-06-08 | Payer: MEDICARE

## 2018-06-07 DIAGNOSIS — F4323 Adjustment disorder with mixed anxiety and depressed mood: Secondary | ICD-10-CM

## 2018-06-08 ENCOUNTER — Encounter: Admit: 2018-06-08 | Discharge: 2018-06-08 | Payer: MEDICARE

## 2018-06-08 DIAGNOSIS — C7931 Secondary malignant neoplasm of brain: ICD-10-CM

## 2018-06-08 DIAGNOSIS — F4323 Adjustment disorder with mixed anxiety and depressed mood: Principal | ICD-10-CM

## 2018-06-08 DIAGNOSIS — F411 Generalized anxiety disorder: Principal | ICD-10-CM

## 2018-06-09 ENCOUNTER — Encounter: Admit: 2018-06-09 | Discharge: 2018-06-09 | Payer: MEDICARE

## 2018-06-09 DIAGNOSIS — C49A3 Gastrointestinal stromal tumor of small intestine: Principal | ICD-10-CM

## 2018-06-19 ENCOUNTER — Encounter: Admit: 2018-06-19 | Discharge: 2018-06-19 | Payer: MEDICARE

## 2018-06-19 DIAGNOSIS — C49A0 Gastrointestinal stromal tumor, unspecified site: Principal | ICD-10-CM

## 2018-06-22 ENCOUNTER — Encounter: Admit: 2018-06-22 | Discharge: 2018-06-22 | Payer: MEDICARE

## 2018-06-22 MED ORDER — TRAMADOL 50 MG PO TAB
ORAL_TABLET | Freq: Four times a day (QID) | 3 refills | PRN
Start: 2018-06-22 — End: ?

## 2018-07-25 ENCOUNTER — Encounter: Admit: 2018-07-25 | Discharge: 2018-07-25 | Payer: MEDICARE

## 2018-08-13 DEATH — deceased

## 2018-10-11 ENCOUNTER — Encounter: Admit: 2018-10-11 | Discharge: 2018-10-11 | Payer: MEDICARE

## 2018-12-30 IMAGING — CR ABDOMEN
4 series · 4 of 4 positions shown · non-contrast
Comparison: none

[abd uprt x-wise]
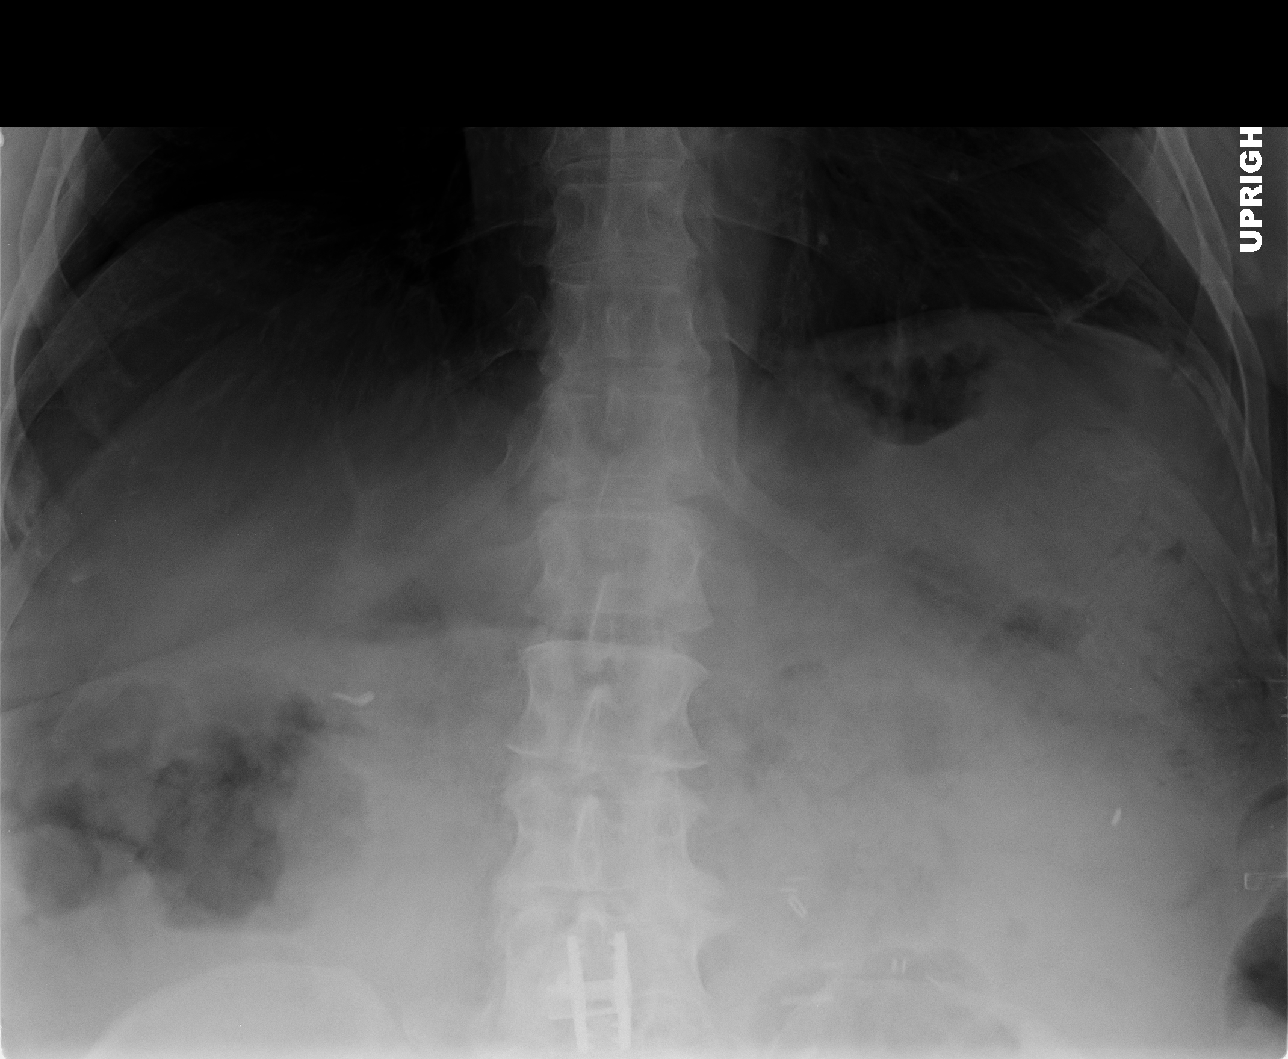

[abd supine x-wise (1 of 3)]
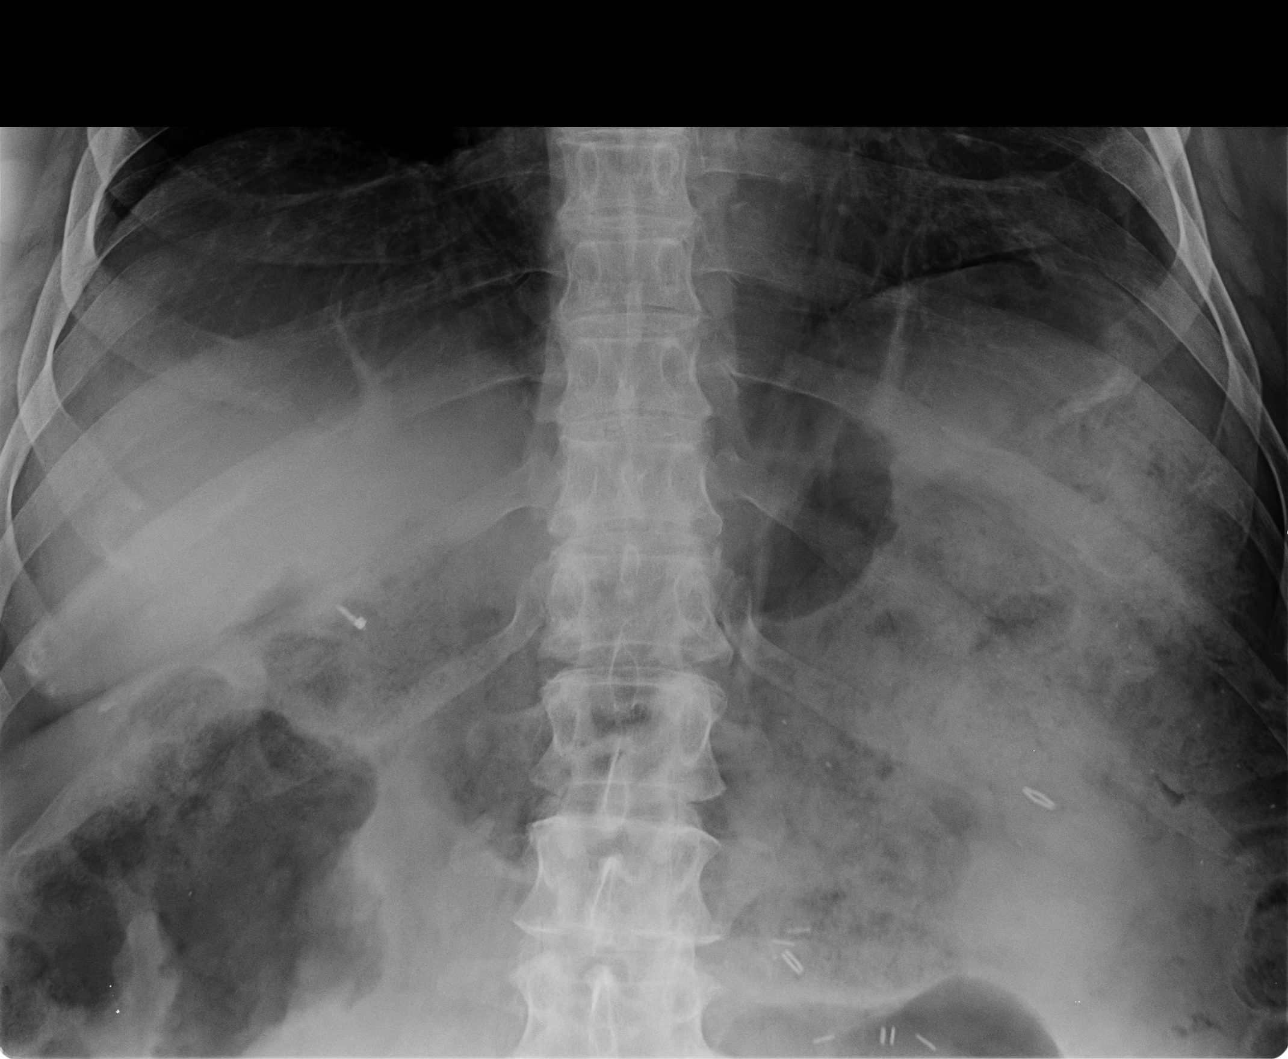

[abd supine x-wise (2 of 3)]
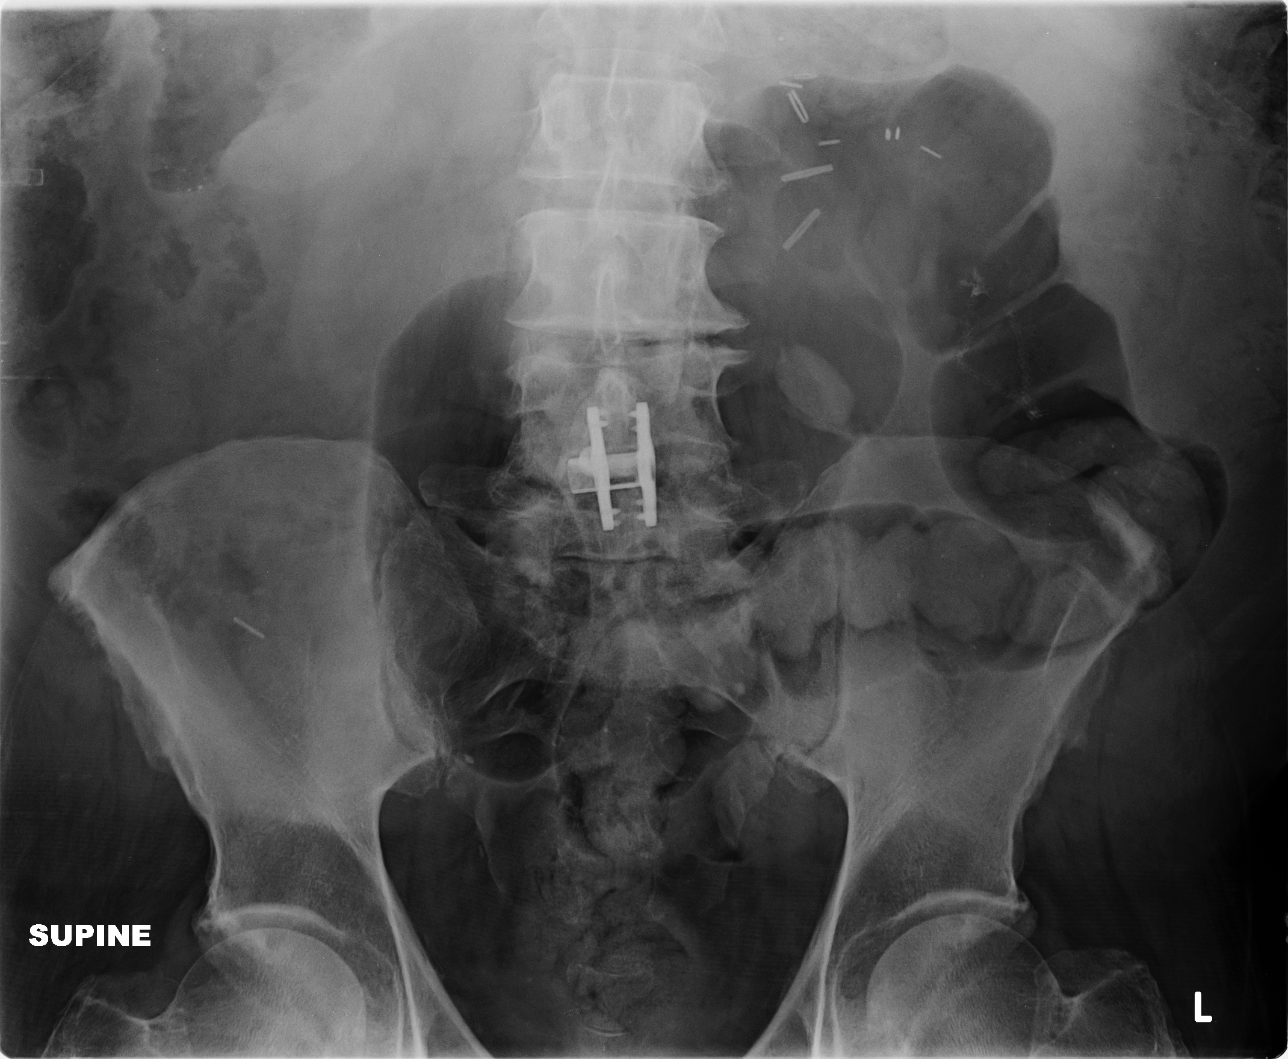

[abd supine x-wise (3 of 3)]
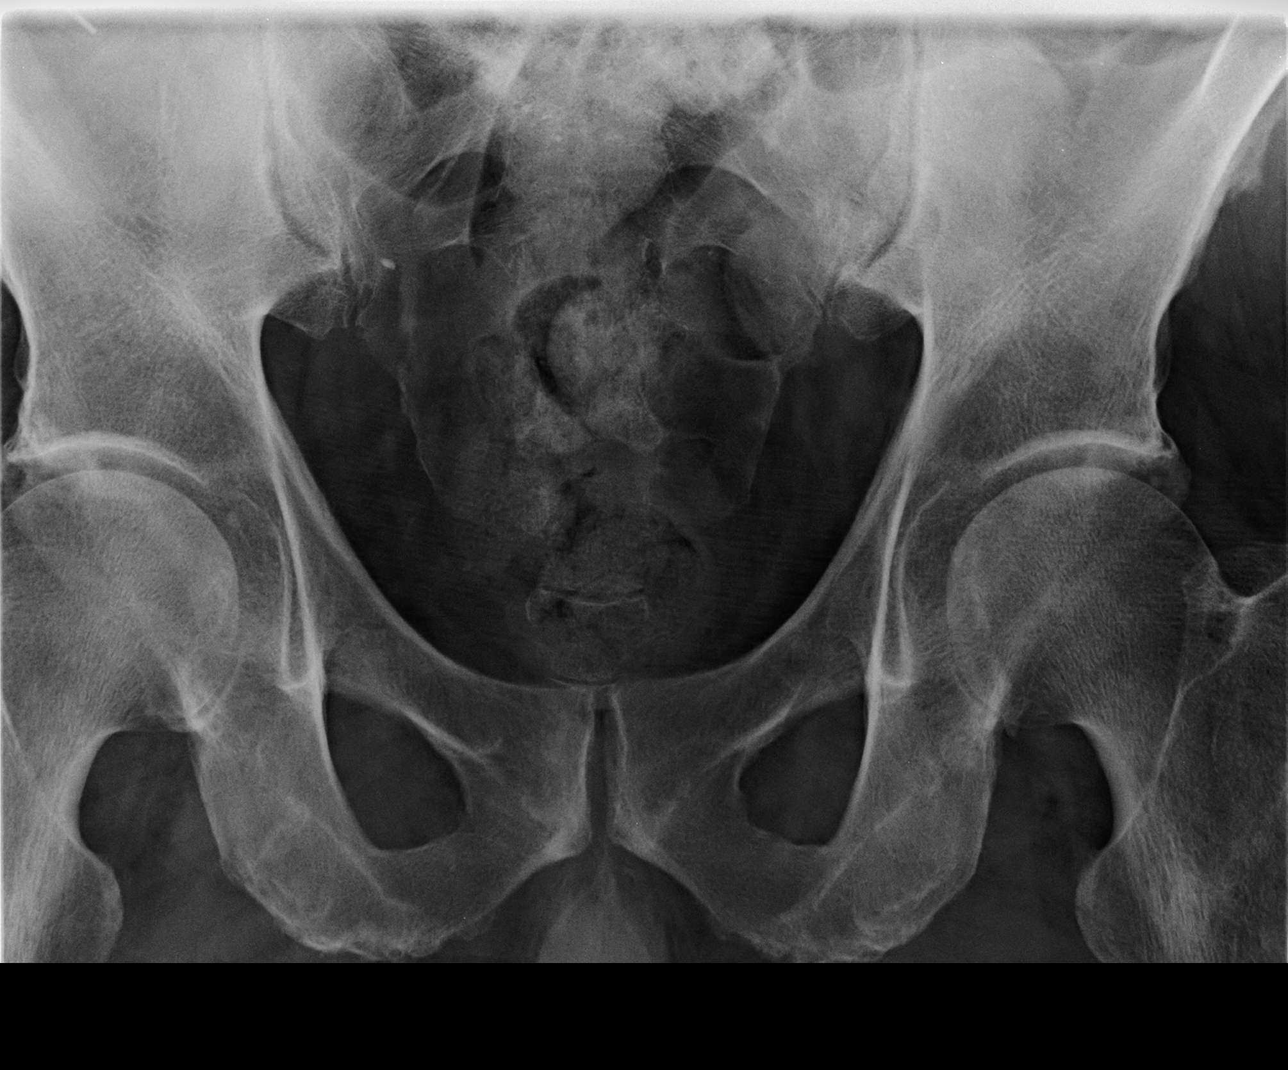

[4 of 4 positions shown; findings below may reference images not displayed]

EXAM

ABDOMEN COMPLETE, RADIOLOGIC EXAMINATION, ABDOMEN; COMPLETE, INCLUDING DECUBITUS AND/OR ERECT
VIEWS CPT 43161

INDICATION

abd pain
ABD PAIN WITH CONSTIPATION; HX OF COLON RESECTION, APPY, GALLBLADDER; HX OF
METASTATIC CA;

COMPARISONS

None

FINDINGS

Upright and supine views of the abdomen show no high grade obstruction or free air. Bowel gas
pattern is unremarkable. No abnormal calcifications. Abdominal surgical clips are present
bilaterally. Aspen fusion device is noted. Moderate to large colorectal stool.

IMPRESSION

No high grade obstruction or free air.

## 2019-02-09 IMAGING — CR UP_EXM
2 series · 2 of 2 positions shown · non-contrast
Comparison: none

[wrist pa]
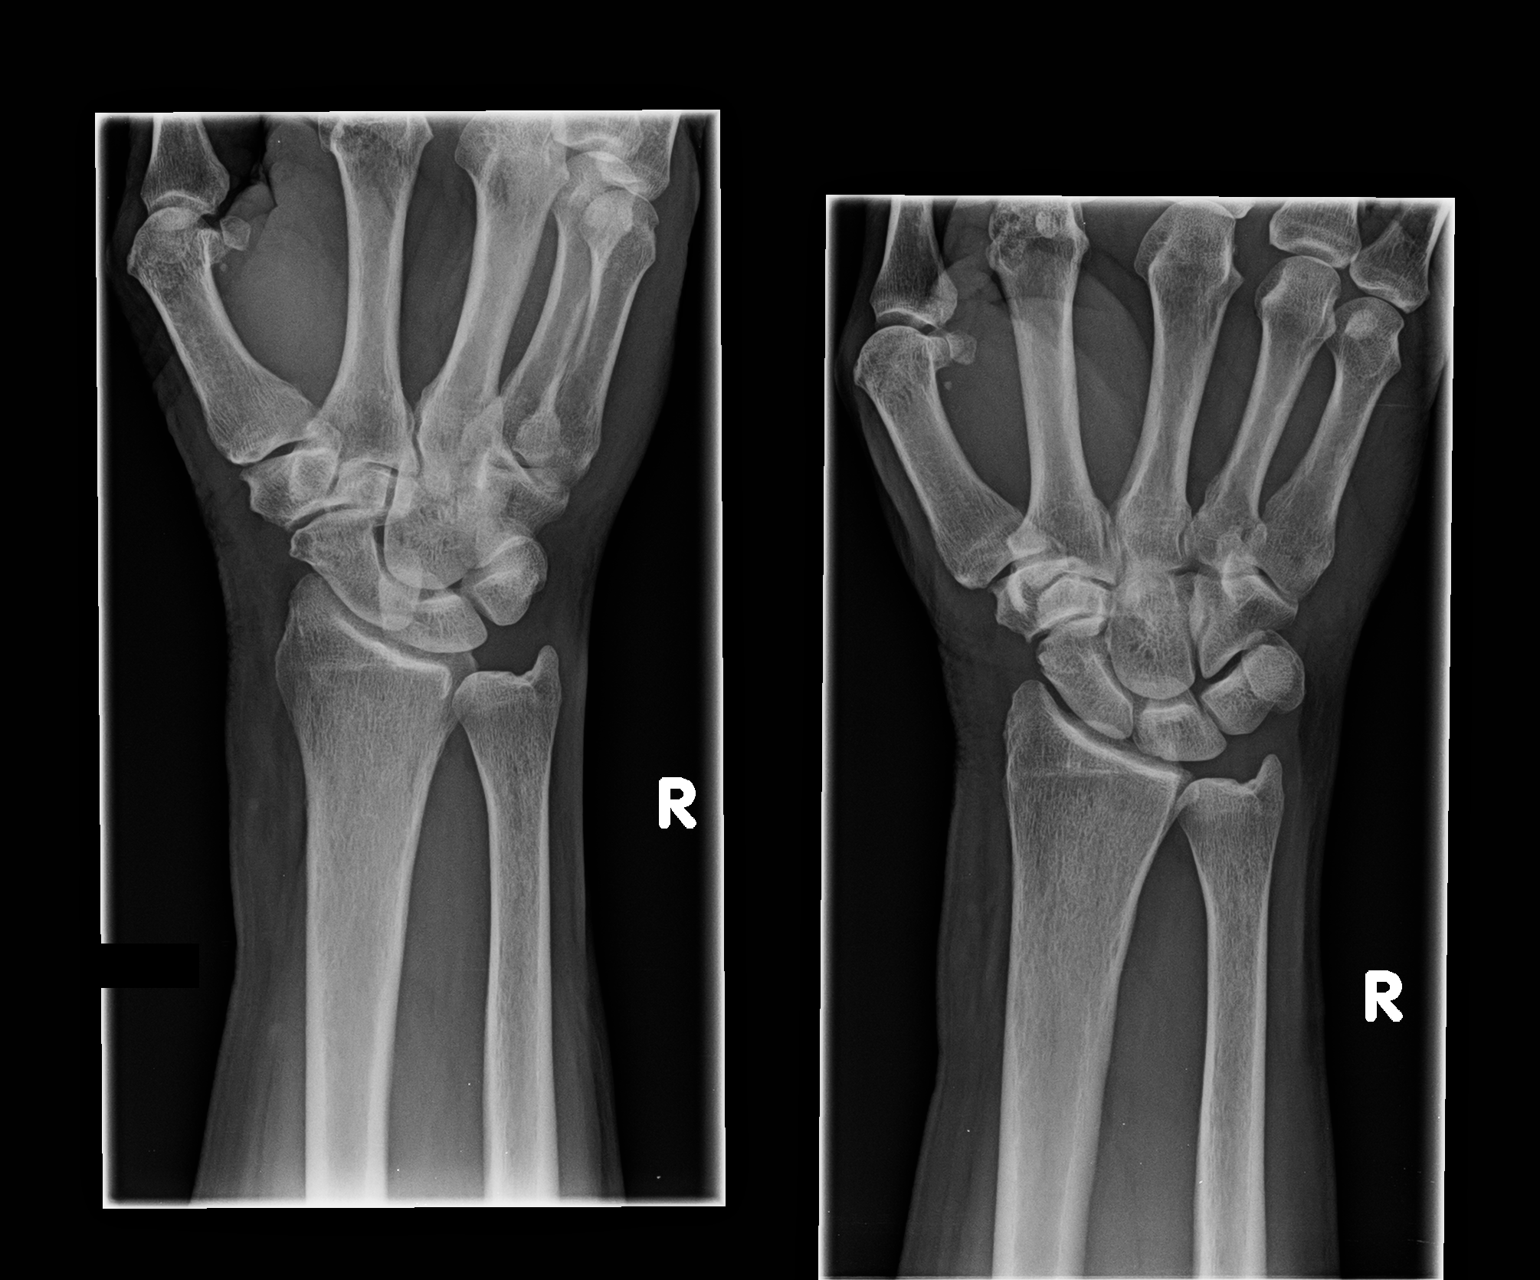

[wrist lat]
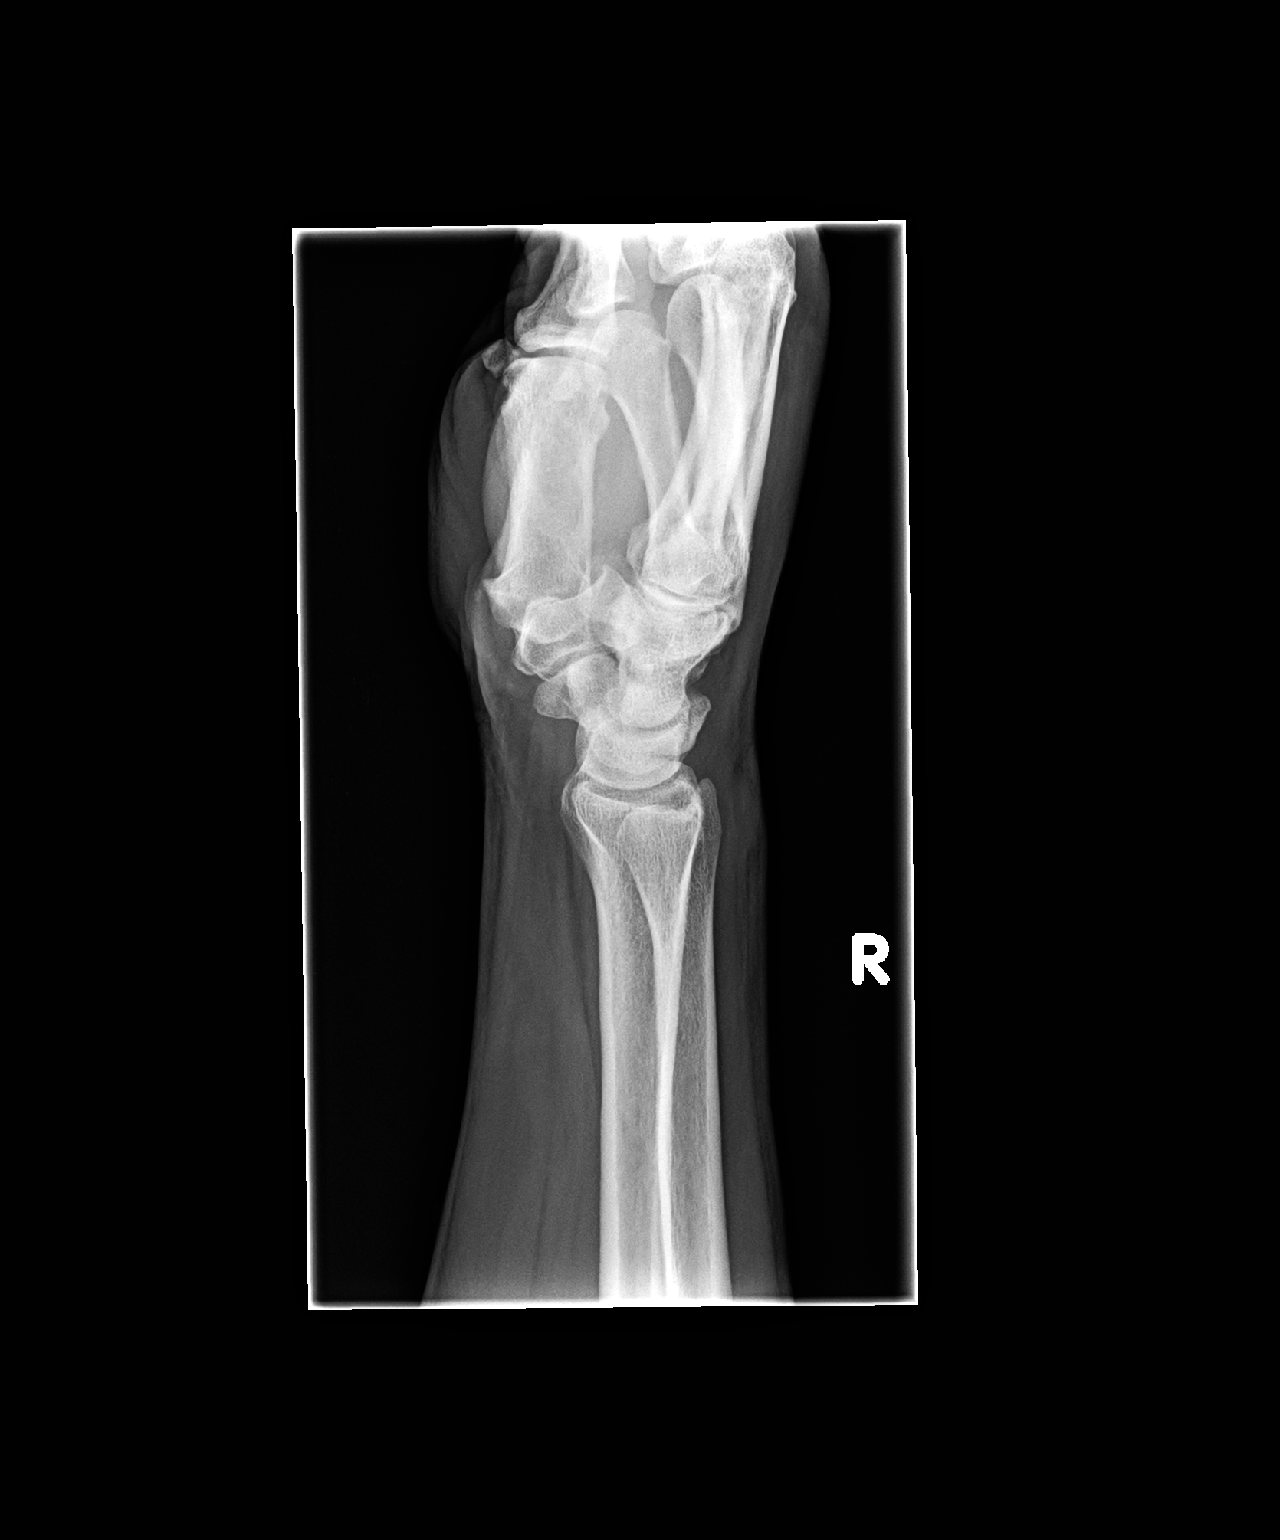

[2 of 2 positions shown; findings below may reference images not displayed]

EXAM
RADIOLOGIC EXAMINATION, WRIST, COMPLETE 3 VIEWS, CPT 40775

INDICATION
snuffbox tenderness after fall 1 day ago
snuffbox tenderness after fall yesterday; rt wrist pain

TECHNIQUE
[AP lateral and oblique of therightwrist were acquired.

COMPARISONS
There are no previous examinations available for comparison at the time of dictation.

FINDINGS
There are no fractures or subluxations identified in the right wrist. There are no abnormal masses
or calcifications. There are no blastic or lytic lesions.
Degenerative changes along the triscaphe joint and 1st carpometacarpal joint are noted. Ulnar minus
variance is present.

IMPRESSION
No acute radiographic abnormalities of the right wrist. Degenerative changes along the 1st CMC
joint and the triscaphe joint.

Tech Notes:

snuffbox tenderness after fall yesterday; rt wrist pain

## 2019-03-14 ENCOUNTER — Encounter: Admit: 2019-03-14 | Discharge: 2019-03-14 | Payer: MEDICARE

## 2021-12-01 ENCOUNTER — Encounter: Admit: 2021-12-01 | Discharge: 2021-12-01 | Payer: MEDICARE

## 2023-01-21 ENCOUNTER — Encounter: Admit: 2023-01-21 | Discharge: 2023-01-21 | Payer: MEDICARE

## 2024-01-19 ENCOUNTER — Encounter: Admit: 2024-01-19 | Discharge: 2024-01-19 | Payer: MEDICARE
